# Patient Record
Sex: Female | Born: 1950 | Race: White | Hispanic: No | Smoking: Never smoker
Health system: Southern US, Community
[De-identification: ages and names within clinical notes are randomized; demographics above are authoritative.]

## PROBLEM LIST (undated history)

## (undated) DIAGNOSIS — I10 Essential (primary) hypertension: Secondary | ICD-10-CM

## (undated) DIAGNOSIS — R51 Headache: Secondary | ICD-10-CM

## (undated) DIAGNOSIS — F5104 Psychophysiologic insomnia: Secondary | ICD-10-CM

## (undated) DIAGNOSIS — I209 Angina pectoris, unspecified: Secondary | ICD-10-CM

## (undated) DIAGNOSIS — M1990 Unspecified osteoarthritis, unspecified site: Secondary | ICD-10-CM

## (undated) DIAGNOSIS — E875 Hyperkalemia: Secondary | ICD-10-CM

## (undated) DIAGNOSIS — E7800 Pure hypercholesterolemia, unspecified: Secondary | ICD-10-CM

## (undated) DIAGNOSIS — Z8719 Personal history of other diseases of the digestive system: Secondary | ICD-10-CM

## (undated) DIAGNOSIS — R519 Headache, unspecified: Secondary | ICD-10-CM

## (undated) DIAGNOSIS — D126 Benign neoplasm of colon, unspecified: Secondary | ICD-10-CM

## (undated) DIAGNOSIS — G4730 Sleep apnea, unspecified: Secondary | ICD-10-CM

## (undated) DIAGNOSIS — E538 Deficiency of other specified B group vitamins: Secondary | ICD-10-CM

## (undated) DIAGNOSIS — F32A Depression, unspecified: Secondary | ICD-10-CM

## (undated) DIAGNOSIS — E119 Type 2 diabetes mellitus without complications: Secondary | ICD-10-CM

## (undated) DIAGNOSIS — S0300XA Dislocation of jaw, unspecified side, initial encounter: Secondary | ICD-10-CM

## (undated) DIAGNOSIS — F419 Anxiety disorder, unspecified: Secondary | ICD-10-CM

## (undated) DIAGNOSIS — M654 Radial styloid tenosynovitis [de Quervain]: Secondary | ICD-10-CM

## (undated) DIAGNOSIS — E559 Vitamin D deficiency, unspecified: Secondary | ICD-10-CM

## (undated) DIAGNOSIS — E785 Hyperlipidemia, unspecified: Secondary | ICD-10-CM

## (undated) DIAGNOSIS — E669 Obesity, unspecified: Secondary | ICD-10-CM

## (undated) DIAGNOSIS — J302 Other seasonal allergic rhinitis: Secondary | ICD-10-CM

## (undated) DIAGNOSIS — T7840XA Allergy, unspecified, initial encounter: Secondary | ICD-10-CM

## (undated) DIAGNOSIS — F329 Major depressive disorder, single episode, unspecified: Secondary | ICD-10-CM

## (undated) HISTORY — PX: HERNIA REPAIR: SHX51

## (undated) HISTORY — PX: CARPAL TUNNEL RELEASE: SHX101

## (undated) HISTORY — PX: COLONOSCOPY: SHX174

## (undated) HISTORY — PX: KNEE ARTHROSCOPY: SUR90

---

## 2006-02-12 ENCOUNTER — Other Ambulatory Visit: Payer: Self-pay

## 2006-02-12 ENCOUNTER — Inpatient Hospital Stay: Payer: Self-pay | Admitting: Internal Medicine

## 2006-06-10 ENCOUNTER — Ambulatory Visit: Payer: Self-pay | Admitting: Family Medicine

## 2006-08-21 ENCOUNTER — Ambulatory Visit: Payer: Self-pay | Admitting: Gastroenterology

## 2007-03-12 ENCOUNTER — Other Ambulatory Visit: Payer: Self-pay

## 2007-03-12 ENCOUNTER — Ambulatory Visit: Payer: Self-pay | Admitting: Orthopedic Surgery

## 2007-03-26 ENCOUNTER — Ambulatory Visit: Payer: Self-pay | Admitting: Orthopedic Surgery

## 2007-07-02 ENCOUNTER — Ambulatory Visit: Payer: Self-pay | Admitting: Family Medicine

## 2008-09-02 ENCOUNTER — Ambulatory Visit: Payer: Self-pay | Admitting: Family Medicine

## 2008-09-20 ENCOUNTER — Ambulatory Visit: Payer: Self-pay | Admitting: Family Medicine

## 2008-11-14 ENCOUNTER — Ambulatory Visit: Payer: Self-pay | Admitting: Obstetrics and Gynecology

## 2008-11-14 ENCOUNTER — Ambulatory Visit: Payer: Self-pay | Admitting: Cardiology

## 2008-11-18 ENCOUNTER — Ambulatory Visit: Payer: Self-pay | Admitting: Obstetrics and Gynecology

## 2009-10-18 ENCOUNTER — Ambulatory Visit: Payer: Self-pay | Admitting: Family Medicine

## 2010-12-20 ENCOUNTER — Ambulatory Visit: Payer: Self-pay | Admitting: Family Medicine

## 2011-09-29 ENCOUNTER — Observation Stay: Payer: Self-pay | Admitting: Internal Medicine

## 2011-09-29 LAB — COMPREHENSIVE METABOLIC PANEL
Anion Gap: 13 (ref 7–16)
Calcium, Total: 9.1 mg/dL (ref 8.5–10.1)
Chloride: 107 mmol/L (ref 98–107)
Co2: 25 mmol/L (ref 21–32)
EGFR (African American): >60
EGFR (Non-African Amer.): >60
Osmolality: 298 (ref 275–301)
Potassium: 4.3 mmol/L (ref 3.5–5.1)
SGOT(AST): 22 U/L (ref 15–37)
SGPT (ALT): 26 U/L
Sodium: 145 mmol/L (ref 136–145)

## 2011-09-29 LAB — HEMOGLOBIN A1C: Hemoglobin A1C: 8.1 % — ABNORMAL HIGH (ref 4.2–6.3)

## 2011-09-29 LAB — CBC
HGB: 12.5 g/dL (ref 12.0–16.0)
MCH: 30.3 pg (ref 26.0–34.0)
RBC: 4.13 10*6/uL (ref 3.80–5.20)
RDW: 12.3 % (ref 11.5–14.5)
WBC: 8.3 10*3/uL (ref 3.6–11.0)

## 2011-09-29 LAB — CK-MB: CK-MB: 1.4 ng/mL (ref 0.5–3.6)

## 2011-09-29 LAB — APTT: Activated PTT: 28 secs (ref 23.6–35.9)

## 2011-09-29 LAB — TROPONIN I: Troponin-I: <0.02 ng/mL

## 2011-09-29 LAB — CK TOTAL AND CKMB (NOT AT ARMC): CK-MB: 1.4 ng/mL (ref 0.5–3.6)

## 2011-09-29 LAB — PROTIME-INR
INR: 0.9
Prothrombin Time: 12.4 secs (ref 11.5–14.7)

## 2011-09-30 LAB — LIPID PANEL
Cholesterol: 127 mg/dL (ref 0–200)
HDL Cholesterol: 38 mg/dL — ABNORMAL LOW (ref 40–60)
Ldl Cholesterol, Calc: 46 mg/dL (ref 0–100)
Triglycerides: 217 mg/dL — ABNORMAL HIGH (ref 0–200)
VLDL Cholesterol, Calc: 43 mg/dL — ABNORMAL HIGH (ref 5–40)

## 2011-09-30 LAB — BASIC METABOLIC PANEL
Calcium, Total: 9.3 mg/dL (ref 8.5–10.1)
Chloride: 104 mmol/L (ref 98–107)
Co2: 26 mmol/L (ref 21–32)
Glucose: 83 mg/dL (ref 65–99)
Osmolality: 289 (ref 275–301)
Potassium: 3.9 mmol/L (ref 3.5–5.1)

## 2011-09-30 LAB — CK-MB: CK-MB: 1.3 ng/mL (ref 0.5–3.6)

## 2011-09-30 LAB — CBC WITH DIFFERENTIAL/PLATELET
Basophil %: 0.4 %
Eosinophil %: 2.7 %
HGB: 12.2 g/dL (ref 12.0–16.0)
Lymphocyte %: 42.7 %
MCH: 31.2 pg (ref 26.0–34.0)
MCHC: 34 g/dL (ref 32.0–36.0)
Monocyte #: 0.7 10*3/uL (ref 0.0–0.7)
Monocyte %: 7.6 %
Neutrophil #: 4.4 10*3/uL (ref 1.4–6.5)
Neutrophil %: 46.6 %
Platelet: 259 10*3/uL (ref 150–440)
RBC: 3.93 10*6/uL (ref 3.80–5.20)

## 2011-09-30 LAB — TROPONIN I: Troponin-I: <0.02 ng/mL

## 2011-10-06 ENCOUNTER — Emergency Department: Payer: Self-pay | Admitting: Emergency Medicine

## 2011-10-06 LAB — COMPREHENSIVE METABOLIC PANEL
Alkaline Phosphatase: 84 U/L (ref 50–136)
Bilirubin,Total: 0.3 mg/dL (ref 0.2–1.0)
Calcium, Total: 9 mg/dL (ref 8.5–10.1)
Co2: 24 mmol/L (ref 21–32)
Creatinine: 0.83 mg/dL (ref 0.60–1.30)
EGFR (African American): >60
EGFR (Non-African Amer.): >60
Osmolality: 305 (ref 275–301)
Potassium: 4.4 mmol/L (ref 3.5–5.1)
SGPT (ALT): 30 U/L
Sodium: 145 mmol/L (ref 136–145)
Total Protein: 7.2 g/dL (ref 6.4–8.2)

## 2011-10-06 LAB — CBC
HCT: 38 % (ref 35.0–47.0)
HGB: 13.1 g/dL (ref 12.0–16.0)
MCH: 31.4 pg (ref 26.0–34.0)
MCHC: 34.4 g/dL (ref 32.0–36.0)
RBC: 4.17 10*6/uL (ref 3.80–5.20)
WBC: 8.1 10*3/uL (ref 3.6–11.0)

## 2011-10-06 LAB — TROPONIN I: Troponin-I: <0.02 ng/mL

## 2011-10-07 DIAGNOSIS — G4733 Obstructive sleep apnea (adult) (pediatric): Secondary | ICD-10-CM | POA: Insufficient documentation

## 2011-10-07 DIAGNOSIS — F32A Depression, unspecified: Secondary | ICD-10-CM | POA: Insufficient documentation

## 2012-03-19 DIAGNOSIS — F5104 Psychophysiologic insomnia: Secondary | ICD-10-CM | POA: Insufficient documentation

## 2012-08-13 ENCOUNTER — Ambulatory Visit: Payer: Self-pay | Admitting: Family Medicine

## 2012-08-13 LAB — RAPID STREP-A WITH REFLX: Micro Text Report: NEGATIVE

## 2012-08-13 LAB — RAPID INFLUENZA A&B ANTIGENS

## 2012-08-15 LAB — BETA STREP CULTURE(ARMC)

## 2012-10-22 DIAGNOSIS — M1990 Unspecified osteoarthritis, unspecified site: Secondary | ICD-10-CM | POA: Insufficient documentation

## 2012-11-23 DIAGNOSIS — E875 Hyperkalemia: Secondary | ICD-10-CM | POA: Insufficient documentation

## 2013-01-11 IMAGING — US US CAROTID DUPLEX BILAT
1 series · 17 of 24 positions shown · non-contrast
Comparison: none

REASON FOR EXAM: near syncope
COMMENTS:

[Series 1: us carotid duplex bilat · 17 of 70 slices shown]
[im 1/70]
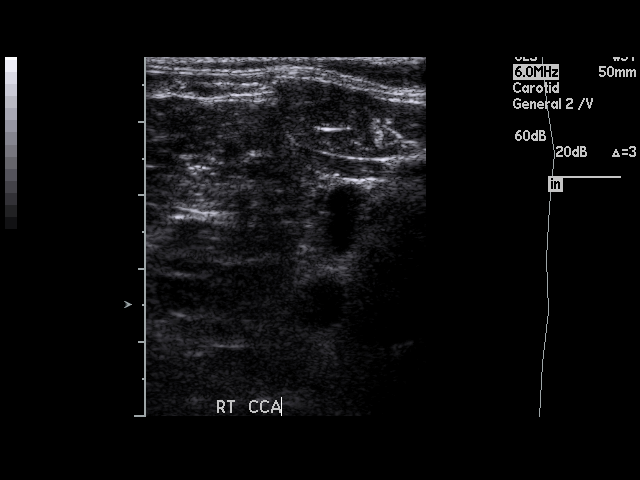
[im 7/70]
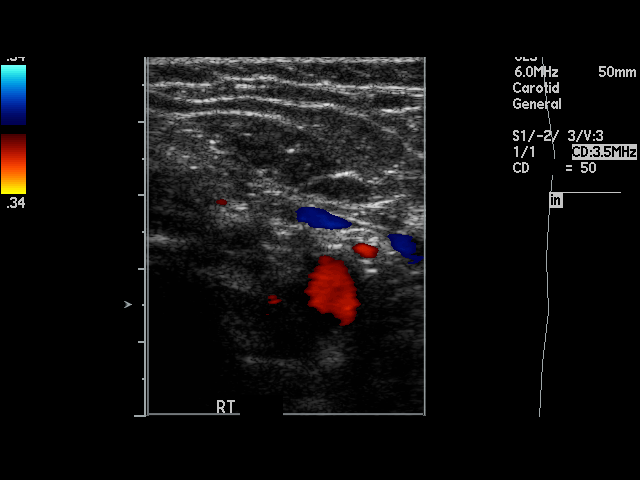
[im 10/70]
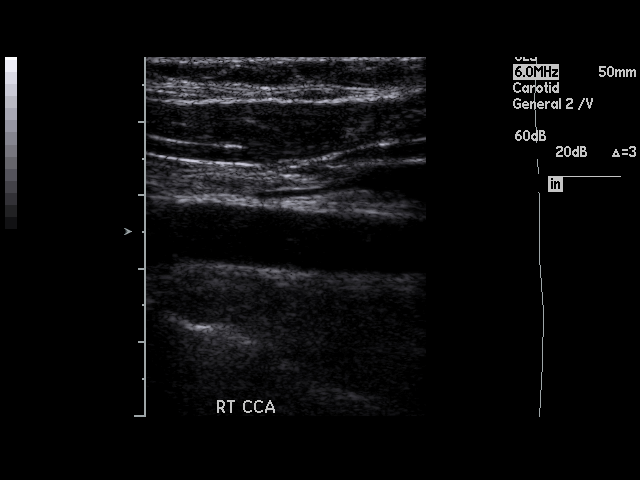
[im 13/70]
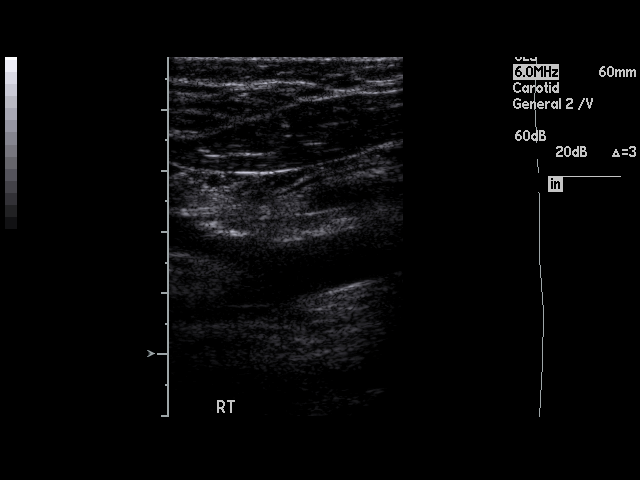
[im 19/70]
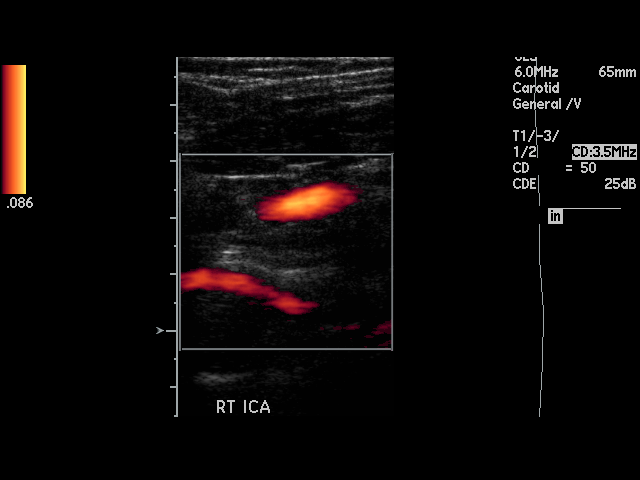
[im 22/70]
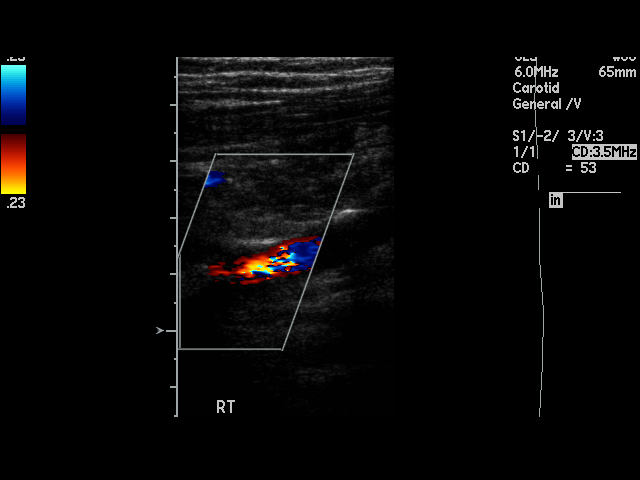
[im 28/70]
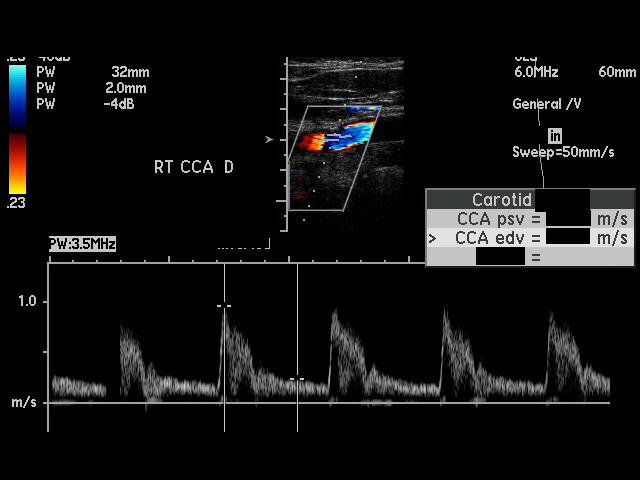
[im 31/70]
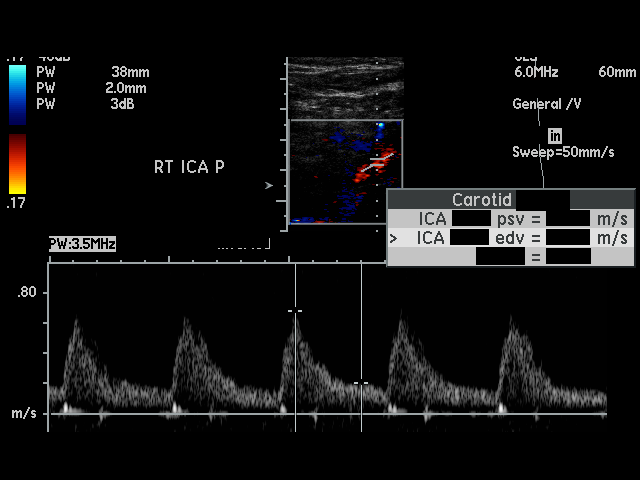
[im 37/70]
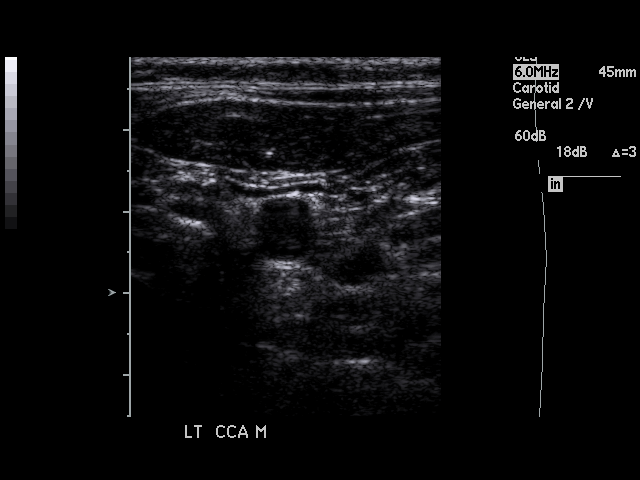
[im 40/70]
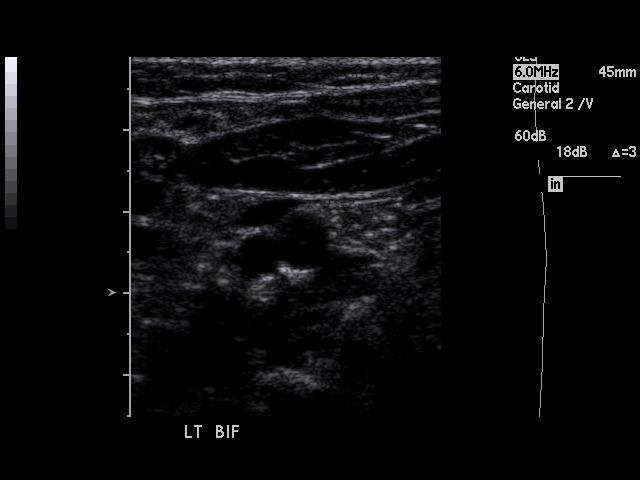
[im 43/70]
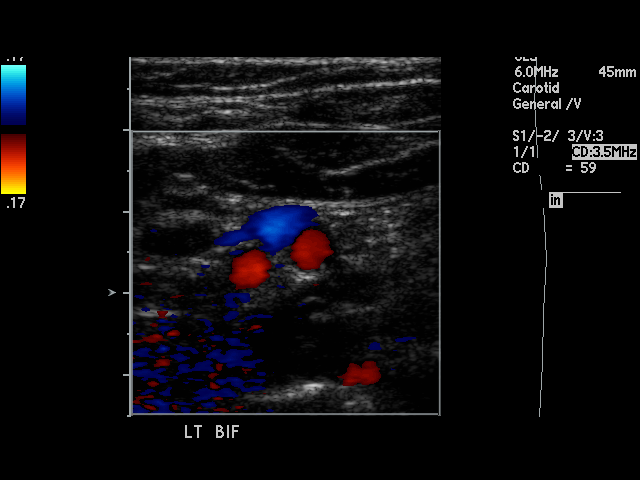
[im 49/70]
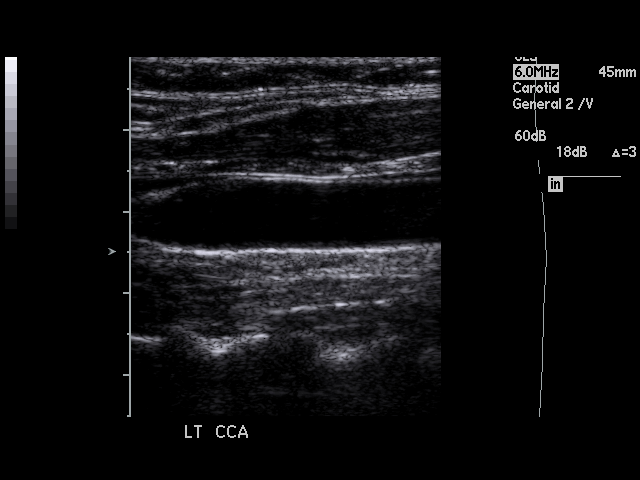
[im 52/70]
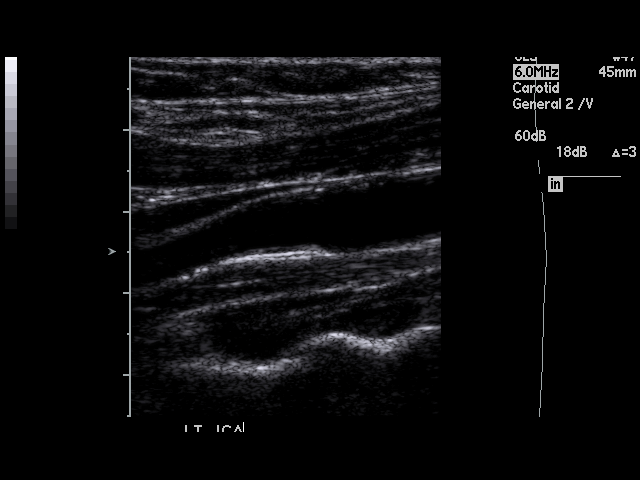
[im 58/70]
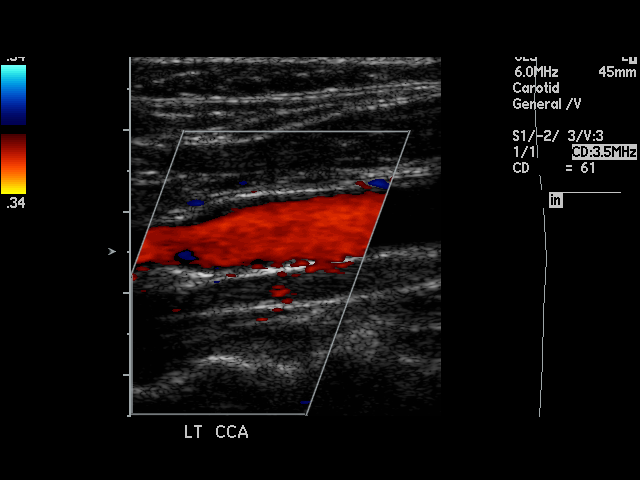
[im 61/70]
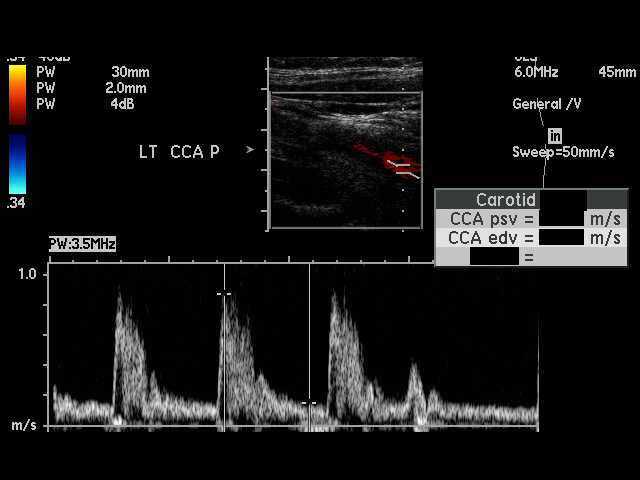
[im 64/70]
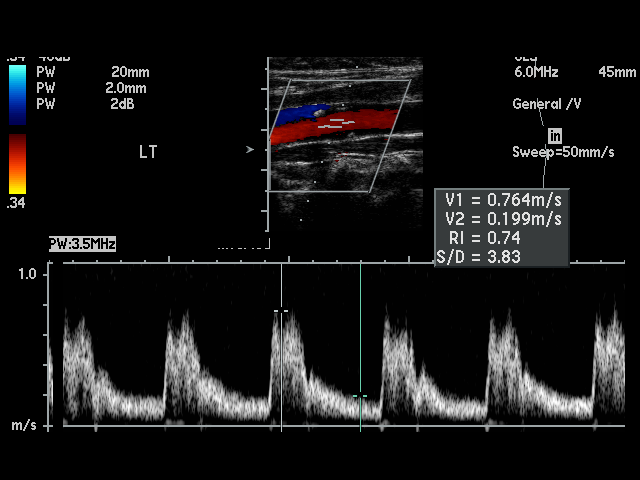
[im 70/70]
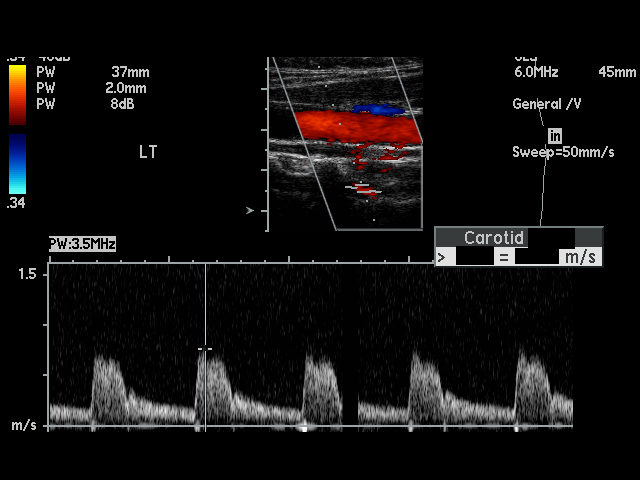

[17 of 24 positions shown; findings below may reference images not displayed]

PROCEDURE:     US  - US CAROTID DOPPLER BILATERAL  - September 29, 2011  [DATE]

RESULT:     Carotid Doppler interrogation demonstrates the presence of
calcified plaque in the carotid bulb region of the right carotid artery with
some intimal thickening with calcified and noncalcified plaques in the left
carotid bulb and proximal internal carotid. Neither of these sites cause
significant stenosis. The color and spectral Doppler appearance is within
normal limits bilaterally. Antegrade flow is seen in the vertebral arteries
bilaterally. The peak systolic velocities are normal. The internal to common
carotid peak systolic velocity ratio is 0.95 normal right and 1.22 on the
left.
IMPRESSION: 1. Minimal atherosclerotic disease. No evidence of hemodynamically
significant stenosis.

## 2013-02-09 ENCOUNTER — Ambulatory Visit: Payer: Self-pay

## 2013-02-19 ENCOUNTER — Ambulatory Visit: Payer: Self-pay | Admitting: Emergency Medicine

## 2013-02-23 ENCOUNTER — Ambulatory Visit: Payer: Self-pay | Admitting: Family Medicine

## 2013-04-07 DIAGNOSIS — E538 Deficiency of other specified B group vitamins: Secondary | ICD-10-CM | POA: Insufficient documentation

## 2013-08-08 ENCOUNTER — Ambulatory Visit: Payer: Self-pay | Admitting: Family Medicine

## 2013-12-26 DIAGNOSIS — E669 Obesity, unspecified: Secondary | ICD-10-CM | POA: Insufficient documentation

## 2014-01-09 DIAGNOSIS — Z8719 Personal history of other diseases of the digestive system: Secondary | ICD-10-CM | POA: Insufficient documentation

## 2014-03-23 ENCOUNTER — Ambulatory Visit: Payer: Self-pay | Admitting: Family Medicine

## 2014-06-08 DIAGNOSIS — N3946 Mixed incontinence: Secondary | ICD-10-CM | POA: Insufficient documentation

## 2014-07-15 ENCOUNTER — Ambulatory Visit: Payer: Self-pay | Admitting: Physician Assistant

## 2014-07-15 LAB — COMPREHENSIVE METABOLIC PANEL
Albumin: 4.1 g/dL (ref 3.4–5.0)
Alkaline Phosphatase: 96 U/L
Anion Gap: 13 (ref 7–16)
BILIRUBIN TOTAL: 0.4 mg/dL (ref 0.2–1.0)
BUN: 18 mg/dL (ref 7–18)
CREATININE: 0.81 mg/dL (ref 0.60–1.30)
Calcium, Total: 10 mg/dL (ref 8.5–10.1)
Chloride: 100 mmol/L (ref 98–107)
Co2: 27 mmol/L (ref 21–32)
EGFR (Non-African Amer.): >60
Glucose: 230 mg/dL — ABNORMAL HIGH (ref 65–99)
OSMOLALITY: 289 (ref 275–301)
POTASSIUM: 4.1 mmol/L (ref 3.5–5.1)
SGOT(AST): 34 U/L (ref 15–37)
SGPT (ALT): 49 U/L
SODIUM: 140 mmol/L (ref 136–145)
Total Protein: 7.9 g/dL (ref 6.4–8.2)

## 2014-09-23 ENCOUNTER — Ambulatory Visit: Payer: Self-pay | Admitting: Family Medicine

## 2014-12-04 NOTE — H&P (Signed)
PATIENT NAME:  Diana Roberts, Diana Roberts MR#:  [id number] DATE OF BIRTH:  1950/12/16  DATE OF ADMISSION:  09/29/2011  ED REFERRING PHYSICIAN: Dr. Fonnie Birkenhead  PRIMARY CARE PHYSICIAN: Dr. Margret Chance   CHIEF COMPLAINT: Double vision, near syncope, chest pressure.   HISTORY OF PRESENT ILLNESS: Patient is a 64 year old white female with history of depression, sleep apnea, hyperlipidemia, type 2 diabetes, previous history of chest pain who presents to the ED with multiple complaints. Patient reports that she earlier this morning was driving her car when all of a sudden she started to have double vision. She saw the road started being double so she had to pull over. Also yesterday she reports that she was feeling dizzy and almost had an episode where she passed out but did not pass out. This has been going on for the past 2 to 3 weeks. In terms of her vision she did not have these visual difficulties previously. She has not had any pain in her eyes. No redness. No drainage. Her last eye exam was about a year ago. She reports that now the double vision has resolved. In terms of the near passing out episode, patient reports that there is no relationship to standing or sitting. She just feels very dizzy all of a sudden and feels like she is going to pass out but has not passed out. She has not had any episodes of her heart beating fast or skipping beats. Patient also reports that she has been having left-sided chest pressure as well as pain going on for the past two weeks. She reports that the pain/pressure goes from her chest to her back and then sometimes comes back to front. She also states that sometimes deep breathing makes it worse, also certain movements make her chest pressure worse. She denies any dyspnea on exertion, orthopnea. No fevers. No chills. No cough. She is a diabetic and noticed her blood sugars to be 47 last night, ate some sweets and this morning her sugars were back up to 133. She otherwise  denies any abdominal pain, nausea, vomiting, or diarrhea.   PAST MEDICAL HISTORY:  1. Depression.  2. Sleep apnea, uses CPAP at bedtime.  3. Hyperlipidemia.  4. Diabetes type 2, insulin requiring.  5. History of chest pain in 2010 with negative stress test.    PAST SURGICAL HISTORY:  1. Status post right knee arthroscopy. 2. Status post right trigger finger release.  3. Bilateral carpal tunnel release.  4. Multiple abdominal hernia surgeries. 5. Left knee arthroscopy. 6. Cesarean section x2.   ALLERGIES: Cipro.   CURRENT MEDICATIONS:  1. Aspirin 81, 1 tab p.o. daily.  2. Carvedilol 3.125, 1 tab p.o. b.i.d.  3. Cyclobenzaprine 10 mg 1 tab p.o. daily as needed.  4. Glyburide 5 mg daily.  5. Imitrex 100, 1 tab p.o. daily as needed.  6. Lantus 70 units at bedtime. 7. Lisinopril 40 daily.  8. Metformin 2000 mg at bedtime.   9. Metformin 500 mg q.a.m.  10. NovoLog sliding scale insulin 4 units q.a.m., 8 units at noon, 12 units at bedtime.  11. Sertraline 100, 1 tab p.o. daily.  12. Simvastatin 40 mg at bedtime.  13. Ambien 10 at bedtime as needed.   SOCIAL HISTORY: Denies any alcohol use. No smoking or drugs.   FAMILY HISTORY: Father had died from complication of aortic aneurysm. Mother died from cancer.    REVIEW OF SYSTEMS: CONSTITUTIONAL: Complains of fatigue, weakness, chest pain as above. No weight loss. No  weight gain. EYES: Double vision as described above. No blurred vision. No pain. No redness. No inflammation. She was told that she may have early cataracts on her last eye exam. ENT: No tinnitus. No ear pain. No hearing loss. No allergies, seasonal or year-round. No epistaxis. No nasal discharge. No postnasal drip. No difficulty swallowing. RESPIRATORY: No cough. No wheezing. No hemoptysis. No chronic obstructive pulmonary disease. No tuberculosis. CARDIOVASCULAR: Chest pain as above. No orthopnea. No edema. No arrhythmia. No palpitations. No syncope. GENITOURINARY: Denies  any dysuria, hematuria, renal calculus, or frequency. ENDO: Denies any polydipsia, nocturia, or thyroid problems. HEME/LYMPH: Denies any anemia, easy bruisability, or bleeding. SKIN: No acne. No rash. No changes in mole or hair. MUSCULOSKELETAL: Denies any pain in neck, back, or shoulder. NEURO: No numbness. No cerebrovascular accident. No transient ischemic attack. PSYCHIATRIC: No anxiety. No insomnia. No ADD. No OCD.   PHYSICAL EXAMINATION:  VITAL SIGNS: Temperature 96.4, pulse 67, respirations 22, blood pressure 152/62.   GENERAL: Patient is an obese female, currently not in any acute distress.   HEENT: Head atraumatic, normocephalic. Pupils equally round, reactive to light and accommodation. Anicteric sclerae. No conjunctival pallor. Extraocular movements intact. Oropharynx is clear. There are no exudate. Nasal exam shows no drainage or ulceration.   NECK: There is no thyromegaly. No carotid bruits.   CARDIOVASCULAR: Regular rate and rhythm. No murmurs, rubs, clicks, or gallops. PMI is not displaced.   LUNGS: No accessory muscle usage. Clear to auscultation bilaterally without any rales, rhonchi, wheezing.   ABDOMEN: Soft, nontender, nondistended. Positive bowel sounds x4. There is no hepatosplenomegaly.   EXTREMITIES: No clubbing, cyanosis, edema.   SKIN: There is no rash.   LYMPHATICS: No lymph nodes palpable.   MUSCULOSKELETAL: There is no erythema or swelling.   VASCULAR: Good DP, PT pulses.   SKIN: There is no rash.   PSYCHIATRIC: Not anxious or depressed.   NEUROLOGICAL: Cranial nerves II through XII grossly intact. No focal deficits.   LABORATORY, DIAGNOSTIC, AND RADIOLOGICAL DATA: CT scan of the head without contrast shows no acute abnormality. PA and lateral chest x-ray showed no acute cardiopulmonary processes. CPK 112, CK-MB 1.4, glucose 208, BUN 23, creatinine 0.79, sodium 145, potassium 4.3, chloride 107, CO2 25. LFTs were normal. WBC 8.3, hemoglobin 12.5, platelet  count 274. Troponin was less than 0.02.   ASSESSMENT AND PLAN: Patient is a 64 year old white female who comes in with double vision, near syncope, chest pain.  1. Double vision, which is now resolved. Also has near syncope. At this time will make sure that she is not having any symptoms of transient ischemic attack. Will check carotids, echocardiogram. If these evaluations are negative then she will need outpatient ophthalmology evaluation. Her last examination one year ago. Will also monitor on telemetry.  2. Chest pressure. Seems very atypical but in light of having near syncope, has multiple risk factors for underlying coronary artery disease  go ahead and do a stress maybe in the a.m. Check a d-dimer, if elevated will need CT per PE protocol.  3. Sleep apnea. Continue CPAP as taking at home.  4. Diabetes, type 2. Will continue Lantus, glyburide and metformin. Due to hypoglycemia last night will hold premeal scheduled insulin.  5. Hyperlipidemia. Continue simvastatin. Check a fasting lipid panel in the a.m.  6. Depression. Will continue sertraline as taking.  7. Miscellaneous. I will place her on Lovenox for deep vein thrombosis prophylaxis.   TIME SPENT: 35 minutes.   ____________________________ Lafonda Mosses Posey Pronto, MD  shp:cms D: 09/29/2011 12:37:16 ET T: 09/29/2011 13:16:02 ET JOB#: [id number]  cc: Shree Espey H. Posey Pronto, MD, <Dictator> Kathrynn Humble. Dollene Primrose, MD Alric Seton MD ELECTRONICALLY SIGNED 10/18/2011 7:47

## 2014-12-04 NOTE — Discharge Summary (Signed)
PATIENT NAME:  Diana Roberts, Diana Roberts MR#:  [id number] DATE OF BIRTH:  1951-06-29  DATE OF ADMISSION:  09/29/2011 DATE OF DISCHARGE:  09/30/2011  ADMITTING PHYSICIAN:  Dr. Dustin Flock DISCHARGING PHYSICIAN: Dr. Gladstone Lighter   PRIMARY CARE PHYSICIAN: Dr. Dollene Primrose    CONSULTATIONS IN THE HOSPITAL:  None.  DISCHARGE DIAGNOSES:  1. Near syncopal episode with dizziness and double vision, likely stress related, poor diabetes control. 2. Sleep apnea.  3. Insulin-dependent diabetes mellitus.  4. Depression.  5. Hyperlipemia.   DISCHARGE MEDICATIONS:  1. Lantus 70 units subcutaneous at bedtime.  2. Imitrex 100 mg p.o. daily.  3. Aspirin 81 mg p.o. daily.  4. Flexeril 10 mg p.o. daily p.r.n.  5. Ambien 10 mg at bedtime daily.  6. Coreg 3.125 mg p.o. b.i.d.  7. Simvastatin 40 mg p.o. daily.  8. Lisinopril 40 mg p.o. daily.  9. Zoloft 100 mg p.o. daily.  10. Metformin 2000 mg p.o. at bedtime and metformin 500 mg p.o. in the morning.  11. Glyburide 5 mg p.o. daily.  12. NovoLog insulin 4 units subcutaneous q. a.m. prior to breakfast, 8 units at noon, and 12 units at  bedtime.   DISCHARGE DIET: Low sodium ADA diet.   DISCHARGE ACTIVITY: As tolerated.     FOLLOWUP INSTRUCTIONS:   Primary care physician followup in one week.    LABS AT THE TIME OF DISCHARGE: WBC 9.4, hemoglobin 12.3, hematocrit 36, and platelet count 259.  Sodium 144, potassium 3.9, chloride 104, bicarbonate 26, BUN 22, creatinine 0.7, glucose of 83, calcium 9.3.   LDL 46, HDL 38, triglycerides 217, total cholesterol 127. Cardiac enzymes have remained negative while in the hospital.   Chest x-ray on admission showing no acute cardiopulmonary disease. CT of the head without contrast showing no acute intracranial abnormality. Ultrasound carotid Doppler showing minimal atherosclerotic disease without evidence of hemodynamically significant stenosis.   Echo Doppler showing normal LV systolic function, ejection fraction of  65%, mild mitral and tricuspid regurgitation.  Hemoglobin A1c elevated at 8.1. Stress test showing negative LexiScan infusion, ejection fraction of 65%. No evidence of infarction or ischemia.   HOSPITAL COURSE:  Diana Roberts is a 64 year old female with past medical history significant for hypertension, diabetes, sleep apnea, and depression who presented to the hospital complaining of dizziness and near syncopal episode.  1. Near syncopal episode with dizziness and double vision:  She has been having intermittent symptoms like this over the past 2 to 3 weeks. She says she is under a lot of stress as her husband is sick and has not been taking care of herself including not taking medications for the last month. She was admitted to telemetry under observation. Her telemetry monitoring was normal sinus rhythm. She did not have any of those episodes while she was in the hospital. Her carotid Dopplers, CT head, and echo were normal and she had a normal LexiScan Myoview. Most likely her symptoms could have been related to the recent stress that she is experiencing or poorly controlled diabetes. It has been a while since she has gone to the ophthalmologist so she is advised to have outpatient followup with ophthalmology for her double vision. 2. Diabetes mellitus, hemoglobin A1c 8.1: She can continue her home medications of metformin, Lantus, and glyburide.  3. Sleep apnea:  She uses CPAP at bedtime. 4. Depression: She is on Zoloft for the same.    5. No changes have been made to her medications. She was advised to continue all medications. Follow  up with PCP in one week.   DISCHARGE CONDITION: Stable.   DISCHARGE DISPOSITION: Home.   TIME SPENT ON DISCHARGE: 40 minutes.   ____________________________ Gladstone Lighter, MD rk:bjt D: 09/30/2011 14:56:00 ET T: 10/01/2011 11:01:50 ET JOB#: [id number]  cc: Kathrynn Humble. Dollene Primrose, MD Gladstone Lighter, MD, <Dictator> Gladstone Lighter MD ELECTRONICALLY SIGNED  10/02/2011 11:55

## 2015-02-01 ENCOUNTER — Encounter: Payer: Self-pay | Admitting: *Deleted

## 2015-02-07 NOTE — Progress Notes (Signed)
Called Dr.Brasingtons office to verify medication orders, okay to use neosynephrine with pt allergy per Jenny Reichmann at [place] office.

## 2015-02-07 NOTE — Discharge Instructions (Signed)

## 2015-02-08 ENCOUNTER — Ambulatory Visit
Admission: RE | Admit: 2015-02-08 | Discharge: 2015-02-08 | Disposition: A | Discharge status: Home / Self Care | Payer: BLUE CROSS/BLUE SHIELD | Source: Ambulatory Visit | Attending: Ophthalmology | Admitting: Ophthalmology

## 2015-02-08 ENCOUNTER — Ambulatory Visit: Payer: BLUE CROSS/BLUE SHIELD | Admitting: Student in an Organized Health Care Education/Training Program

## 2015-02-08 ENCOUNTER — Encounter
Admission: RE | Disposition: A | Discharge status: Home / Self Care | Payer: BLUE CROSS/BLUE SHIELD | Source: Ambulatory Visit | Attending: Ophthalmology

## 2015-02-08 ENCOUNTER — Encounter: Payer: Self-pay | Admitting: *Deleted

## 2015-02-08 DIAGNOSIS — Z886 Allergy status to analgesic agent status: Secondary | ICD-10-CM | POA: Insufficient documentation

## 2015-02-08 DIAGNOSIS — K449 Diaphragmatic hernia without obstruction or gangrene: Secondary | ICD-10-CM | POA: Insufficient documentation

## 2015-02-08 DIAGNOSIS — E780 Pure hypercholesterolemia: Secondary | ICD-10-CM | POA: Diagnosis not present

## 2015-02-08 DIAGNOSIS — Z881 Allergy status to other antibiotic agents status: Secondary | ICD-10-CM | POA: Insufficient documentation

## 2015-02-08 DIAGNOSIS — M1990 Unspecified osteoarthritis, unspecified site: Secondary | ICD-10-CM | POA: Insufficient documentation

## 2015-02-08 DIAGNOSIS — G4730 Sleep apnea, unspecified: Secondary | ICD-10-CM | POA: Insufficient documentation

## 2015-02-08 DIAGNOSIS — R062 Wheezing: Secondary | ICD-10-CM | POA: Diagnosis not present

## 2015-02-08 DIAGNOSIS — R002 Palpitations: Secondary | ICD-10-CM | POA: Insufficient documentation

## 2015-02-08 DIAGNOSIS — H2512 Age-related nuclear cataract, left eye: Secondary | ICD-10-CM | POA: Insufficient documentation

## 2015-02-08 DIAGNOSIS — F329 Major depressive disorder, single episode, unspecified: Secondary | ICD-10-CM | POA: Diagnosis not present

## 2015-02-08 DIAGNOSIS — I209 Angina pectoris, unspecified: Secondary | ICD-10-CM | POA: Diagnosis not present

## 2015-02-08 DIAGNOSIS — M7989 Other specified soft tissue disorders: Secondary | ICD-10-CM | POA: Diagnosis not present

## 2015-02-08 DIAGNOSIS — E119 Type 2 diabetes mellitus without complications: Secondary | ICD-10-CM | POA: Diagnosis not present

## 2015-02-08 DIAGNOSIS — F419 Anxiety disorder, unspecified: Secondary | ICD-10-CM | POA: Diagnosis not present

## 2015-02-08 DIAGNOSIS — G43909 Migraine, unspecified, not intractable, without status migrainosus: Secondary | ICD-10-CM | POA: Diagnosis not present

## 2015-02-08 DIAGNOSIS — I10 Essential (primary) hypertension: Secondary | ICD-10-CM | POA: Diagnosis not present

## 2015-02-08 HISTORY — DX: Unspecified osteoarthritis, unspecified site: M19.90

## 2015-02-08 HISTORY — DX: Headache, unspecified: R51.9

## 2015-02-08 HISTORY — PX: CATARACT EXTRACTION W/PHACO: SHX586

## 2015-02-08 HISTORY — DX: Angina pectoris, unspecified: I20.9

## 2015-02-08 HISTORY — DX: Anxiety disorder, unspecified: F41.9

## 2015-02-08 HISTORY — DX: Major depressive disorder, single episode, unspecified: F32.9

## 2015-02-08 HISTORY — DX: Essential (primary) hypertension: I10

## 2015-02-08 HISTORY — DX: Depression, unspecified: F32.A

## 2015-02-08 HISTORY — DX: Other seasonal allergic rhinitis: J30.2

## 2015-02-08 HISTORY — DX: Pure hypercholesterolemia, unspecified: E78.00

## 2015-02-08 HISTORY — DX: Headache: R51

## 2015-02-08 HISTORY — DX: Sleep apnea, unspecified: G47.30

## 2015-02-08 HISTORY — DX: Personal history of other diseases of the digestive system: Z87.19

## 2015-02-08 HISTORY — DX: Type 2 diabetes mellitus without complications: E11.9

## 2015-02-08 LAB — GLUCOSE, CAPILLARY
GLUCOSE-CAPILLARY: 120 mg/dL — AB (ref 65–99)
Glucose-Capillary: 144 mg/dL — ABNORMAL HIGH (ref 65–99)

## 2015-02-08 SURGERY — PHACOEMULSIFICATION, CATARACT, WITH IOL INSERTION
Anesthesia: Monitor Anesthesia Care | Laterality: Left | Wound class: Clean

## 2015-02-08 MED ORDER — OXYCODONE HCL 5 MG PO TABS: 5.0000 mg | ORAL_TABLET | Freq: Once | ORAL | Status: DC | PRN

## 2015-02-08 MED ORDER — EPINEPHRINE HCL 1 MG/ML IJ SOLN
INTRAOCULAR | Status: DC | PRN
  Administered 2015-02-08: 66 mL via OPHTHALMIC

## 2015-02-08 MED ORDER — PHENYLEPHRINE HCL 10 % OP SOLN
1.0000 [drp] | OPHTHALMIC | Status: DC | PRN
  Administered 2015-02-08 (×4): 1 [drp] via OPHTHALMIC

## 2015-02-08 MED ORDER — BRIMONIDINE TARTRATE 0.2 % OP SOLN
OPHTHALMIC | Status: DC | PRN
  Administered 2015-02-08: 1 [drp] via OPHTHALMIC

## 2015-02-08 MED ORDER — CEFUROXIME OPHTHALMIC INJECTION 1 MG/0.1 ML
INJECTION | OPHTHALMIC | Status: DC | PRN
  Administered 2015-02-08: .3 mL via INTRACAMERAL

## 2015-02-08 MED ORDER — KETOROLAC TROMETHAMINE 30 MG/ML IJ SOLN: 30.0000 mg | Freq: Once | INTRAMUSCULAR | Status: DC | PRN

## 2015-02-08 MED ORDER — NA HYALUR & NA CHOND-NA HYALUR 0.4-0.35 ML IO KIT
PACK | INTRAOCULAR | Status: DC | PRN
  Administered 2015-02-08: 1 mL via INTRAOCULAR

## 2015-02-08 MED ORDER — HYDROMORPHONE HCL 1 MG/ML IJ SOLN: 0.2500 mg | INTRAMUSCULAR | Status: DC | PRN

## 2015-02-08 MED ORDER — FENTANYL CITRATE (PF) 100 MCG/2ML IJ SOLN
INTRAMUSCULAR | Status: DC | PRN
  Administered 2015-02-08: 50 ug via INTRAVENOUS

## 2015-02-08 MED ORDER — MIDAZOLAM HCL 2 MG/2ML IJ SOLN
INTRAMUSCULAR | Status: DC | PRN
  Administered 2015-02-08: 2 mg via INTRAVENOUS

## 2015-02-08 MED ORDER — PROMETHAZINE HCL 25 MG/ML IJ SOLN: 6.2500 mg | INTRAMUSCULAR | Status: DC | PRN

## 2015-02-08 MED ORDER — CYCLOPENTOLATE HCL 2 % OP SOLN
1.0000 [drp] | OPHTHALMIC | Status: DC | PRN
  Administered 2015-02-08 (×4): 1 [drp] via OPHTHALMIC

## 2015-02-08 MED ORDER — POLYMYXIN B-TRIMETHOPRIM 10000-0.1 UNIT/ML-% OP SOLN
1.0000 [drp] | OPHTHALMIC | Status: DC | PRN
  Administered 2015-02-08 (×3): 1 [drp] via OPHTHALMIC

## 2015-02-08 MED ORDER — TETRACAINE HCL 0.5 % OP SOLN
1.0000 [drp] | OPHTHALMIC | Status: DC | PRN
  Administered 2015-02-08 (×2): 1 [drp] via OPHTHALMIC

## 2015-02-08 MED ORDER — OXYCODONE HCL 5 MG/5ML PO SOLN: 5.0000 mg | Freq: Once | ORAL | Status: DC | PRN

## 2015-02-08 MED ORDER — TIMOLOL MALEATE 0.5 % OP SOLN
OPHTHALMIC | Status: DC | PRN
  Administered 2015-02-08: 1 [drp] via OPHTHALMIC

## 2015-02-08 MED ORDER — POVIDONE-IODINE 5 % OP SOLN
1.0000 "application " | OPHTHALMIC | Status: DC | PRN
  Administered 2015-02-08: 1 via OPHTHALMIC

## 2015-02-08 MED ORDER — LIDOCAINE HCL 2 % EX GEL: 1.0000 "application " | Freq: Once | CUTANEOUS | Status: DC

## 2015-02-08 SURGICAL SUPPLY — 26 items
CANNULA ANT/CHMB 27GA (MISCELLANEOUS) ×3 IMPLANT
GLOVE SURG LX 7.5 STRW (GLOVE) ×2
GLOVE SURG LX STRL 7.5 STRW (GLOVE) ×1 IMPLANT
GLOVE SURG TRIUMPH 8.0 PF LTX (GLOVE) ×3 IMPLANT
GOWN STRL REUS W/ TWL LRG LVL3 (GOWN DISPOSABLE) ×2 IMPLANT
GOWN STRL REUS W/TWL LRG LVL3 (GOWN DISPOSABLE) ×4
LENS IOL TECNIS 19.5 (Intraocular Lens) ×3 IMPLANT
LENS IOL TECNIS MONO 1P 19.5 (Intraocular Lens) ×1 IMPLANT
MARKER SKIN SURG W/RULER VIO (MISCELLANEOUS) ×3 IMPLANT
NDL RETROBULBAR .5 NSTRL (NEEDLE) IMPLANT
NEEDLE FILTER BLUNT 18X 1/2SAF (NEEDLE) ×2
NEEDLE FILTER BLUNT 18X1 1/2 (NEEDLE) ×1 IMPLANT
PACK CATARACT BRASINGTON (MISCELLANEOUS) ×3 IMPLANT
PACK EYE AFTER SURG (MISCELLANEOUS) ×3 IMPLANT
PACK OPTHALMIC (MISCELLANEOUS) ×3 IMPLANT
RING MALYGIN 7.0 (MISCELLANEOUS) IMPLANT
SUT ETHILON 10-0 CS-B-6CS-B-6 (SUTURE)
SUT VICRYL  9 0 (SUTURE)
SUT VICRYL 9 0 (SUTURE) IMPLANT
SUTURE EHLN 10-0 CS-B-6CS-B-6 (SUTURE) IMPLANT
SYR 3ML LL SCALE MARK (SYRINGE) ×3 IMPLANT
SYR 5ML LL (SYRINGE) IMPLANT
SYR TB 1ML LUER SLIP (SYRINGE) ×3 IMPLANT
WATER STERILE IRR 250ML POUR (IV SOLUTION) ×3 IMPLANT
WATER STERILE IRR 500ML POUR (IV SOLUTION) IMPLANT
WIPE NON LINTING 3.25X3.25 (MISCELLANEOUS) ×3 IMPLANT

## 2015-02-08 NOTE — Op Note (Signed)
OPERATIVE NOTE  Diana Roberts [id number] 02/08/2015   PREOPERATIVE DIAGNOSIS:  Nuclear sclerotic cataract left eye. H25.12   POSTOPERATIVE DIAGNOSIS:    Nuclear sclerotic cataract left eye.     PROCEDURE:  Phacoemusification with posterior chamber intraocular lens placement of the left eye   LENS:   Implant Name Type Inv. Item Serial No. Manufacturer Lot No. LRB No. Used  LENS IMPL INTRAOC ZCB00 19.5 - [id number] Intraocular Lens LENS IMPL INTRAOC ZCB00 19.5 [id number] AMO   Left 1        ULTRASOUND TIME: 8.5  % of 1 minutes 0 seconds, CDE 5.1  SURGEON:  Wyonia Hough, MD   ANESTHESIA:  Topical with tetracaine drops and 2% Xylocaine jelly.   COMPLICATIONS:  None.   DESCRIPTION OF PROCEDURE:  The patient was identified in the holding room and transported to the operating room and placed in the supine position under the operating microscope.  The left eye was identified as the operative eye and it was prepped and draped in the usual sterile ophthalmic fashion.   A 1 millimeter clear-corneal paracentesis was made at the 1:30 position.  The anterior chamber was filled with Viscoat viscoelastic.  A 2.4 millimeter keratome was used to make a near-clear corneal incision at the 10:30 position.  .  A curvilinear capsulorrhexis was made with a cystotome and capsulorrhexis forceps.  Balanced salt solution was used to hydrodissect and hydrodelineate the nucleus.   Phacoemulsification was then used in stop and chop fashion to remove the lens nucleus and epinucleus.  The remaining cortex was then removed using the irrigation and aspiration handpiece. Provisc was then placed into the capsular bag to distend it for lens placement.  A lens was then injected into the capsular bag.  The remaining viscoelastic was aspirated.   Wounds were hydrated with balanced salt solution.  The anterior chamber was inflated to a physiologic pressure with balanced salt solution.  No wound leaks were noted.  Cefuroxime 0.1 ml of a 10mg /ml solution was injected into the anterior chamber for a dose of 1 mg of intracameral antibiotic at the completion of the case.   Timolol and Brimonidine drops were applied to the eye.  The patient was taken to the recovery room in stable condition without complications of anesthesia or surgery.  [name] 02/08/2015, 8:04 AM

## 2015-02-08 NOTE — Anesthesia Postprocedure Evaluation (Signed)
  Anesthesia Post-op Note  Patient: Diana Roberts  Procedure(s) Performed: Procedure(s) with comments: CATARACT EXTRACTION PHACO AND INTRAOCULAR LENS PLACEMENT (IOC) (Left) - DIABETIC  Anesthesia type:General, MAC  Patient location: PACU  Post pain: Pain level controlled  Post assessment: Post-op Vital signs reviewed, Patient's Cardiovascular Status Stable, Respiratory Function Stable, Patent Airway and No signs of Nausea or vomiting  Post vital signs: Reviewed and stable  Last Vitals:  Filed Vitals:   02/08/15 0808  BP:   Pulse: 65  Temp:   Resp:     Level of consciousness: awake, alert  and patient cooperative  Complications: No apparent anesthesia complications

## 2015-02-08 NOTE — Anesthesia Procedure Notes (Signed)
Procedure Name: MAC Date/Time: 02/08/2015 7:54 AM Performed by: Londell Moh Pre-anesthesia Checklist: Patient identified, Emergency Drugs available, Suction available, Timeout performed and Patient being monitored Patient Re-evaluated:Patient Re-evaluated prior to inductionOxygen Delivery Method: Nasal cannula Placement Confirmation: positive ETCO2

## 2015-02-08 NOTE — H&P (Signed)
  The History and Physical notes were scanned in.  The patient remains stable and unchanged from the H&P.   Previous H&P reviewed, patient examined, and there are no changes.  Oluwatomisin Deman 02/08/2015 7:33 AM

## 2015-02-08 NOTE — Transfer of Care (Signed)
Immediate Anesthesia Transfer of Care Note  Patient: Diana Roberts  Procedure(s) Performed: Procedure(s) with comments: CATARACT EXTRACTION PHACO AND INTRAOCULAR LENS PLACEMENT (IOC) (Left) - DIABETIC  Patient Location: PACU  Anesthesia Type: General, MAC  Level of Consciousness: awake, alert  and patient cooperative  Airway and Oxygen Therapy: Patient Spontanous Breathing and Patient connected to supplemental oxygen  Post-op Assessment: Post-op Vital signs reviewed, Patient's Cardiovascular Status Stable, Respiratory Function Stable, Patent Airway and No signs of Nausea or vomiting  Post-op Vital Signs: Reviewed and stable  Complications: No apparent anesthesia complications

## 2015-02-08 NOTE — Anesthesia Preprocedure Evaluation (Signed)
Anesthesia Evaluation  Patient identified by MRN, date of birth, ID band Patient awake    Reviewed: Allergy & Precautions, NPO status , Patient's Chart, lab work & pertinent test results  Airway Mallampati: II  TM Distance: >3 FB Neck ROM: Full    Dental no notable dental hx.    Pulmonary neg pulmonary ROS,  breath sounds clear to auscultation  Pulmonary exam normal       Cardiovascular hypertension, + angina negative cardio ROS Normal cardiovascular examRhythm:Regular Rate:Normal  No recent symptoms.  Can lie flat.  Mets4+   Neuro/Psych negative neurological ROS  negative psych ROS   GI/Hepatic negative GI ROS, Neg liver ROS, hiatal hernia,   Endo/Other  negative endocrine ROSdiabetes  Renal/GU negative Renal ROS  negative genitourinary   Musculoskeletal negative musculoskeletal ROS (+)   Abdominal   Peds negative pediatric ROS (+)  Hematology negative hematology ROS (+)   Anesthesia Other Findings   Reproductive/Obstetrics negative OB ROS                             Anesthesia Physical Anesthesia Plan  ASA: III  Anesthesia Plan: General and MAC   Post-op Pain Management:    Induction: Intravenous  Airway Management Planned:   Additional Equipment:   Intra-op Plan:   Post-operative Plan: Extubation in OR  Informed Consent: I have reviewed the patients History and Physical, chart, labs and discussed the procedure including the risks, benefits and alternatives for the proposed anesthesia with the patient or authorized representative who has indicated his/her understanding and acceptance.   Dental advisory given  Plan Discussed with: CRNA  Anesthesia Plan Comments:         Anesthesia Quick Evaluation

## 2015-02-09 ENCOUNTER — Encounter: Payer: Self-pay | Admitting: Ophthalmology

## 2015-04-19 DIAGNOSIS — M25879 Other specified joint disorders, unspecified ankle and foot: Secondary | ICD-10-CM | POA: Insufficient documentation

## 2015-04-28 ENCOUNTER — Other Ambulatory Visit: Payer: Self-pay | Admitting: Orthopaedic Surgery

## 2015-04-28 DIAGNOSIS — M25872 Other specified joint disorders, left ankle and foot: Secondary | ICD-10-CM

## 2015-04-28 DIAGNOSIS — M79672 Pain in left foot: Secondary | ICD-10-CM

## 2015-05-01 ENCOUNTER — Ambulatory Visit
Admission: RE | Admit: 2015-05-01 | Discharge: 2015-05-01 | Disposition: A | Discharge status: Home / Self Care | Payer: BLUE CROSS/BLUE SHIELD | Source: Ambulatory Visit | Attending: Orthopaedic Surgery | Admitting: Orthopaedic Surgery

## 2015-05-01 DIAGNOSIS — M79672 Pain in left foot: Secondary | ICD-10-CM | POA: Insufficient documentation

## 2015-05-01 DIAGNOSIS — M25872 Other specified joint disorders, left ankle and foot: Secondary | ICD-10-CM

## 2015-05-01 MED ORDER — GADOBENATE DIMEGLUMINE 529 MG/ML IV SOLN
20.0000 mL | Freq: Once | INTRAVENOUS | Status: AC | PRN
  Administered 2015-05-01: 20 mL via INTRAVENOUS

## 2015-05-10 ENCOUNTER — Ambulatory Visit: Payer: BLUE CROSS/BLUE SHIELD | Attending: Medical | Admitting: Physical Therapy

## 2015-05-10 DIAGNOSIS — M25672 Stiffness of left ankle, not elsewhere classified: Secondary | ICD-10-CM | POA: Insufficient documentation

## 2015-05-10 DIAGNOSIS — M6281 Muscle weakness (generalized): Secondary | ICD-10-CM

## 2015-05-10 DIAGNOSIS — M25572 Pain in left ankle and joints of left foot: Secondary | ICD-10-CM | POA: Diagnosis not present

## 2015-05-10 DIAGNOSIS — R262 Difficulty in walking, not elsewhere classified: Secondary | ICD-10-CM | POA: Insufficient documentation

## 2015-05-11 NOTE — Therapy (Signed)
[facility] [address] [place], [place], [address] Phone: [phone]   Fax:  [phone]  Physical Therapy Evaluation  Patient Details  Name: Diana Roberts MRN: [id number] Date of Birth: June 13, 1951 Referring Provider:  Ulysees Barns, *  Encounter Date: 05/10/2015      PT End of Session - 05/11/15 1406    Visit Number 1   Number of Visits 12   Date for PT Re-Evaluation 06/21/15   Activity Tolerance Patient tolerated treatment well;Patient limited by pain   Behavior During Therapy [facility] for tasks assessed/performed      Past Medical History  Diagnosis Date  . Diabetes mellitus without complication   . Hypertension   . Anxiety   . Headache     migraines  . Seasonal allergies   . History of hiatal hernia   . Arthritis   . Depression   . Hypercholesteremia   . Anginal pain   . Sleep apnea     Report - Care everywhere - 06/09/2014    Past Surgical History  Procedure Laterality Date  . Carpal tunnel release    . Cesarean section    . Hernia repair    . Knee arthroscopy    . Cataract extraction w/phaco Left 02/08/2015    Procedure: CATARACT EXTRACTION PHACO AND INTRAOCULAR LENS PLACEMENT (IOC);  Surgeon: Leandrew Koyanagi, MD;  Location: [place];  Service: Ophthalmology;  Laterality: Left;  DIABETIC    There were no vitals filed for this visit.  Visit Diagnosis:  Left ankle pain  Difficulty walking  Muscle weakness  Ankle joint stiffness, left      Subjective Assessment - 05/11/15 1403    Subjective Pt. reports 7/10 L lateral ankle/ distal gastroc pain.  Pt. reports R ankle has begun to hurt due to compensation during gait/ standing tasks.     Patient is accompained by: Family member   Limitations Standing;Walking;House hold activities;Other (comment)   Patient Stated Goals Decrease B lower leg/ankle pain with standing and walking tasks at home/ work.     Currently in Pain? Yes   Pain Score 7    Pain Location Ankle   Pain Orientation Left;Lower;Lateral   Pain Type Chronic pain   Aggravating Factors  prolonged standing/ work-related tasks.              [facility] PT Assessment - 05/11/15 0001    Assessment   Medical Diagnosis Mass of joint of ankle, left   Onset Date/Surgical Date 01/11/15   Precautions   Precautions None   Restrictions   Weight Bearing Restrictions No   Balance Screen   Has the patient fallen in the past 6 months No       OBJECTIVE:  Therex.:  See HEP (good technique with stretches)- cuing to avoid increase c/o pain.  Manual tx.: Supine L gastroc/soleus/hamstrig stretches 3x each.  STM to L lower leg/ ankle (assessment of L cyst/knot).      Pt response for medical necessity:  Moderate tenderness in L lower leg.  Marked improvement in walking with use of SPC in clinic.            PT Education - 05/11/15 1405    Education provided Yes   Education Details See HEP/ icing/ proper shoe wear (lift to heel).   Person(s) Educated Patient;Spouse   Methods Explanation;Demonstration;Tactile cues;Verbal cues;Handout   Comprehension Returned demonstration;Verbalized understanding;Need further instruction            PT  Long Term Goals - 05/11/15 1413    PT LONG TERM GOAL #1   Title Pt. I with HEP to increase L ankle AROM to WNL as compared to R ankle to improve pain-free mobility with standing/walking.     Time 4   Period Weeks   Status New   PT LONG TERM GOAL #2   Title Pt. will increase LEFS to >40 out of 80 to improve pain-free mobility.     Baseline 9/28  LEFS 24 out of 80.     Time 6   Period Weeks   Status New   PT LONG TERM GOAL #3   Title Pt. able to complete 8 hours of work as [employer] with no increase c/o L ankle pain.     Time 6   Period Weeks   Status New   PT LONG TERM GOAL #4   Title Pt. will ambulate with normalized gait pattern and no increase c/o L ankle pain to improve daily household activities/ work-related  tasks.     Baseline Moderate L antalgic gait pattern.    Time 6   Period Weeks   Status New               Plan - 05/11/15 1402    Clinical Impression Statement Pt. is a pleasant 64 y/o female with >3 months of R lateral ankle/ achilles tendon pain.  Pt. reports 7/10 R ankle pain with prolonged standing/walking tasks and increasing discomfort on R ankle due to overcompensation.  B LE muscle strenth: R quad 5/5 MMT, R hamstring 5/5 MMT, R hip flexion 4/5 MMT, R DF 5/5 MMT.  L quad 5/5 MMT, L hamstring 5/5 MMT, L hip flexion 5/5 MMT, R DF 5/5 MMT.  R hamstring flexibility 90 deg. (WNL).  L hamstring prox. 51 deg., distal WNL.  L DF AROM 8 deg. (muscle cramping in gastroc), L PF AROM 32 deg.  R DF AROM 12 deg., R PF AROM 32 deg.  R IV (30 deg.), EV (10 deg.).  L IV (32 deg.), EV (10 deg.).  Palpation: R medial gastroc medial head tenderness to palpation; L distal hamstring and lateral achilles tenderness.  Notable hard cyst/ knot on L distal, lateral achilles tendon (pain with palpation).  LEFS: 24 out of 80.  Pt. ambulates with L antalgic gait pattern without use of SPC with decresae stance time on L/ increase R swing through phase of gait.  Pt. benefits from use of SPC on R side with 2-point gait pattern with decrease pain and more normalized gait pattern.  Pt. will benefit from skilled PT services to increase L hamstring/ ankle ROM and strength to improve pain-free mobility/ walking.     Pt will benefit from skilled therapeutic intervention in order to improve on the following deficits Abnormal gait;Decreased endurance;Hypomobility;Obesity;Decreased activity tolerance;Decreased knowledge of use of DME;Decreased strength;Pain;Increased muscle spasms;Difficulty walking;Decreased mobility;Decreased range of motion;Improper body mechanics;Postural dysfunction;Impaired flexibility   Rehab Potential Good   PT Frequency 2x / week   PT Duration 6 weeks   PT Treatment/Interventions ADLs/Self Care Home  Management;Aquatic Therapy;Cryotherapy;Electrical Stimulation;Iontophoresis 4mg /ml Dexamethasone;Moist Heat;Balance training;Therapeutic exercise;Therapeutic activities;Functional mobility training;Stair training;Gait training;DME Instruction;Contrast Bath;Ultrasound;Neuromuscular re-education;Patient/family education;Manual techniques;Taping;Dry needling;Passive range of motion   PT Next Visit Plan Review HEP, assess work shoes, improve gait pattern with use of SPC.   PT Home Exercise Plan See HEP   Consulted and Agree with Plan of Care Patient         Problem List There are no  active problems to display for this patient.  Pura Spice, PT, DPT # [phone]   05/11/2015, 2:22 PM  [facility] [address] [place], [place], [address] Phone: [phone]   Fax:  [phone]

## 2015-05-15 ENCOUNTER — Encounter: Payer: BLUE CROSS/BLUE SHIELD | Admitting: Physical Therapy

## 2015-05-17 ENCOUNTER — Encounter: Payer: Self-pay | Admitting: Physical Therapy

## 2015-05-17 ENCOUNTER — Ambulatory Visit: Payer: BLUE CROSS/BLUE SHIELD | Attending: Medical | Admitting: Physical Therapy

## 2015-05-17 DIAGNOSIS — M6281 Muscle weakness (generalized): Secondary | ICD-10-CM | POA: Diagnosis present

## 2015-05-17 DIAGNOSIS — M25672 Stiffness of left ankle, not elsewhere classified: Secondary | ICD-10-CM | POA: Diagnosis present

## 2015-05-17 DIAGNOSIS — R262 Difficulty in walking, not elsewhere classified: Secondary | ICD-10-CM | POA: Diagnosis present

## 2015-05-17 DIAGNOSIS — M25572 Pain in left ankle and joints of left foot: Secondary | ICD-10-CM | POA: Diagnosis present

## 2015-05-17 NOTE — Therapy (Signed)
[facility] [address] [place], [place], [address] Phone: [phone]   Fax:  [phone]  Physical Therapy Treatment  Patient Details  Name: Diana Roberts MRN: [id number] Date of Birth: [birth year] Referring Provider:  Ulysees Barns, *  Encounter Date: 05/17/2015      PT End of Session - 05/17/15 1651    Visit Number 2   Number of Visits 12   Date for PT Re-Evaluation 06/21/15   PT Start Time 0855   PT Stop Time 0945   PT Time Calculation (min) 50 min   Activity Tolerance Patient tolerated treatment well;Patient limited by pain   Behavior During Therapy [facility] for tasks assessed/performed      Past Medical History  Diagnosis Date  . Diabetes mellitus without complication ([place])   . Hypertension   . Anxiety   . Headache     migraines  . Seasonal allergies   . History of hiatal hernia   . Arthritis   . Depression   . Hypercholesteremia   . Anginal pain ([place])   . Sleep apnea     Report - Care everywhere - 06/09/2014    Past Surgical History  Procedure Laterality Date  . Carpal tunnel release    . Cesarean section    . Hernia repair    . Knee arthroscopy    . Cataract extraction w/phaco Left 02/08/2015    Procedure: CATARACT EXTRACTION PHACO AND INTRAOCULAR LENS PLACEMENT (IOC);  Surgeon: Leandrew Koyanagi, MD;  Location: [place];  Service: Ophthalmology;  Laterality: Left;  DIABETIC    There were no vitals filed for this visit.  Visit Diagnosis:  Left ankle pain  Difficulty walking  Muscle weakness  Ankle joint stiffness, left      Subjective Assessment - 05/17/15 1649    Subjective Pt reports no pain upon arrival to PT tx session. Pt states she has had pain with standing at work over the past couple of days. Pt missed last schedule appointment due to illness.   Patient is accompained by: Family member   Limitations Standing;Walking;House hold activities;Other (comment)   Patient  Stated Goals Decrease B lower leg/ankle pain with standing and walking tasks at home/ work.     Currently in Pain? No/denies        OBJECTIVE: There.ex.: standing prostretch 5x each with 30 sec. Holds in //-bars.  Reviewed HEP.  Standing hamstring stretches 3x each on L/R.   Manual: Supine L and R STM to B gastroc./ achilles tendon 13 min.  Soft tissue release technique to L distal achilles around ?cyst region.     Pt response to Tx for medical necessity:  Palpable tenderness in L achilles with increase progression in L DF/ flexibility.  Improved gait pattern with use of SPC in clinic and addition of heel lifts.         PT Long Term Goals - 05/11/15 1413    PT LONG TERM GOAL #1   Title Pt. I with HEP to increase L ankle AROM to WNL as compared to R ankle to improve pain-free mobility with standing/walking.     Time 4   Period Weeks   Status New   PT LONG TERM GOAL #2   Title Pt. will increase LEFS to >40 out of 80 to improve pain-free mobility.     Baseline 9/28  LEFS 24 out of 80.     Time 6   Period Weeks   Status New  PT LONG TERM GOAL #3   Title Pt. able to complete 8 hours of work as [employer] with no increase c/o L ankle pain.     Time 6   Period Weeks   Status New   PT LONG TERM GOAL #4   Title Pt. will ambulate with normalized gait pattern and no increase c/o L ankle pain to improve daily household activities/ work-related tasks.     Baseline Moderate L antalgic gait pattern.    Time 6   Period Weeks   Status New            Plan - 05/17/15 1652    Clinical Impression Statement Palpable tenderness to L distal achilles tendon (?cyst) with STM/ release technique.  Increase L DF noted after gastroc/LE stretching ex. program.  Pt. reports no increase c/o pain with addition of B inner shoe heel lifts (1 cm.) each.     Pt will benefit from skilled therapeutic intervention in order to improve on the following deficits Abnormal gait;Decreased  endurance;Hypomobility;Obesity;Decreased activity tolerance;Decreased knowledge of use of DME;Decreased strength;Pain;Increased muscle spasms;Difficulty walking;Decreased mobility;Decreased range of motion;Improper body mechanics;Postural dysfunction;Impaired flexibility   Rehab Potential Good   PT Frequency 2x / week   PT Duration 6 weeks   PT Treatment/Interventions ADLs/Self Care Home Management;Aquatic Therapy;Cryotherapy;Electrical Stimulation;Iontophoresis 4mg /ml Dexamethasone;Moist Heat;Balance training;Therapeutic exercise;Therapeutic activities;Functional mobility training;Stair training;Gait training;DME Instruction;Contrast Bath;Ultrasound;Neuromuscular re-education;Patient/family education;Manual techniques;Taping;Dry needling;Passive range of motion   PT Next Visit Plan Review HEP, improve gait pattern with use of SPC. Recheck use of heel lifts and discuss SPC.    PT Home Exercise Plan See HEP   Consulted and Agree with Plan of Care Patient        Problem List There are no active problems to display for this patient.  Pura Spice, PT, DPT # [phone]   05/18/2015, 11:53 AM  [facility] [address] [place], [place], [address] Phone: [phone]   Fax:  [phone]

## 2015-05-23 ENCOUNTER — Ambulatory Visit: Payer: BLUE CROSS/BLUE SHIELD | Admitting: Physical Therapy

## 2015-05-23 DIAGNOSIS — M25572 Pain in left ankle and joints of left foot: Secondary | ICD-10-CM | POA: Diagnosis not present

## 2015-05-23 DIAGNOSIS — R262 Difficulty in walking, not elsewhere classified: Secondary | ICD-10-CM

## 2015-05-23 DIAGNOSIS — M6281 Muscle weakness (generalized): Secondary | ICD-10-CM

## 2015-05-23 DIAGNOSIS — M25672 Stiffness of left ankle, not elsewhere classified: Secondary | ICD-10-CM

## 2015-05-24 ENCOUNTER — Encounter: Payer: Self-pay | Admitting: Physical Therapy

## 2015-05-24 NOTE — Therapy (Signed)
[facility] [address] [place], [place], [address] Phone: [phone]   Fax:  [phone]  Physical Therapy Treatment  Patient Details  Name: Diana Roberts MRN: [id number] Date of Birth: 03/24/1951 Referring Provider:  Ulysees Barns, *  Encounter Date: 05/23/2015      PT End of Session - 05/24/15 0716    Visit Number 3   Number of Visits 12   Date for PT Re-Evaluation 06/21/15   PT Start Time 1629   PT Stop Time 7062   PT Time Calculation (min) 46 min   Activity Tolerance Patient tolerated treatment well;Patient limited by pain;No increased pain   Behavior During Therapy [facility] for tasks assessed/performed      Past Medical History  Diagnosis Date  . Diabetes mellitus without complication ([place])   . Hypertension   . Anxiety   . Headache     migraines  . Seasonal allergies   . History of hiatal hernia   . Arthritis   . Depression   . Hypercholesteremia   . Anginal pain ([place])   . Sleep apnea     Report - Care everywhere - 06/09/2014    Past Surgical History  Procedure Laterality Date  . Carpal tunnel release    . Cesarean section    . Hernia repair    . Knee arthroscopy    . Cataract extraction w/phaco Left 02/08/2015    Procedure: CATARACT EXTRACTION PHACO AND INTRAOCULAR LENS PLACEMENT (IOC);  Surgeon: Leandrew Koyanagi, MD;  Location: [place];  Service: Ophthalmology;  Laterality: Left;  DIABETIC    There were no vitals filed for this visit.  Visit Diagnosis:  No diagnosis found.      Subjective Assessment - 05/24/15 0714    Subjective Pt reports pain all over her body except for B knees. Pt came from work to PT appt today. Pt arrived with SPC and L wrist splint for dequervains.    Patient is accompained by: Family member   Limitations Standing;Walking;House hold activities;Other (comment)   Patient Stated Goals Decrease B lower leg/ankle pain with standing and walking tasks at  home/ work.     Currently in Pain? Yes   Pain Score 5    Pain Location Ankle   Pain Orientation Left   Pain Descriptors / Indicators Constant;Tightness;Pressure   Pain Type Chronic pain         OBJECTIVE: Manual: Supine L and R STM to B gastroc./ achilles tendon 20 min.Passive stretching to B gastrocs. Grade II subtalar joint mobilizations to aid in dorsiflexion 4 x 20 seconds bilateral (pt tender and unable to tolerate grade III). Ice in supine to L achilles to end session. Heel lift decreased by one lift pad.   Pt response to tx for medical necessity: Pt benefits from soft tissue mobilization and passive stretching to decrease pain with standing and work related tasks. Pt benefits from joint mobilization to increase pain free ROM.         PT Long Term Goals - 05/11/15 1413    PT LONG TERM GOAL #1   Title Pt. I with HEP to increase L ankle AROM to WNL as compared to R ankle to improve pain-free mobility with standing/walking.     Time 4   Period Weeks   Status New   PT LONG TERM GOAL #2   Title Pt. will increase LEFS to >40 out of 80 to improve pain-free mobility.     Baseline 9/28  LEFS 24 out of 80.     Time 6   Period Weeks   Status New   PT LONG TERM GOAL #3   Title Pt. able to complete 8 hours of work as [employer] with no increase c/o L ankle pain.     Time 6   Period Weeks   Status New   PT LONG TERM GOAL #4   Title Pt. will ambulate with normalized gait pattern and no increase c/o L ankle pain to improve daily household activities/ work-related tasks.     Baseline Moderate L antalgic gait pattern.    Time 6   Period Weeks   Status New            Plan - 05/24/15 0717    Clinical Impression Statement Pt reports discomfort from B shoe lift (1 cm) in the balls of her feet with prolonged standing. One layer was removed from her heel lift. Pt reports B tenderness with subtalar mobilizations to increase dorsiflexion (L worse than R). Pt has increased tenderness  with palpation and STM to bilateral gastrocs (L around cyst?, R medial head and distal attachment). Pt found relief with manual gastroc stretching to increase dorsiflexion.   Pt will benefit from skilled therapeutic intervention in order to improve on the following deficits Abnormal gait;Decreased endurance;Hypomobility;Obesity;Decreased activity tolerance;Decreased knowledge of use of DME;Decreased strength;Pain;Increased muscle spasms;Difficulty walking;Decreased mobility;Decreased range of motion;Improper body mechanics;Postural dysfunction;Impaired flexibility   Rehab Potential Good   PT Frequency 2x / week   PT Duration 6 weeks   PT Treatment/Interventions ADLs/Self Care Home Management;Aquatic Therapy;Cryotherapy;Electrical Stimulation;Iontophoresis 4mg /ml Dexamethasone;Moist Heat;Balance training;Therapeutic exercise;Therapeutic activities;Functional mobility training;Stair training;Gait training;DME Instruction;Contrast Bath;Ultrasound;Neuromuscular re-education;Patient/family education;Manual techniques;Taping;Dry needling;Passive range of motion   PT Next Visit Plan progress to strengthening/Reassess ROM   PT Home Exercise Plan See HEP   Consulted and Agree with Plan of Care Patient        Problem List There are no active problems to display for this patient.   Lavone Neri, SPT 05/24/2015, 7:38 AM  [facility] [address] [place], [place], [address] Phone: [phone]   Fax:  [phone]

## 2015-05-25 ENCOUNTER — Encounter: Payer: Self-pay | Admitting: Physical Therapy

## 2015-05-25 ENCOUNTER — Ambulatory Visit: Payer: BLUE CROSS/BLUE SHIELD | Admitting: Physical Therapy

## 2015-05-25 DIAGNOSIS — M25672 Stiffness of left ankle, not elsewhere classified: Secondary | ICD-10-CM

## 2015-05-25 DIAGNOSIS — M25572 Pain in left ankle and joints of left foot: Secondary | ICD-10-CM

## 2015-05-25 DIAGNOSIS — M6281 Muscle weakness (generalized): Secondary | ICD-10-CM

## 2015-05-25 DIAGNOSIS — R262 Difficulty in walking, not elsewhere classified: Secondary | ICD-10-CM

## 2015-05-25 NOTE — Therapy (Signed)
[facility] [address] [place], [place], [address] Phone: [phone]   Fax:  [phone]  Physical Therapy Treatment  Patient Details  Name: Diana Roberts MRN: [id number] Date of Birth: [birth year] No Data Recorded  Encounter Date: 05/25/2015      PT End of Session - 05/25/15 1757    Visit Number 4   Number of Visits 12   Date for PT Re-Evaluation 06/21/15   PT Start Time 1630   PT Stop Time 1712   PT Time Calculation (min) 42 min   Activity Tolerance Patient tolerated treatment well;Patient limited by pain;No increased pain   Behavior During Therapy [facility] for tasks assessed/performed      Past Medical History  Diagnosis Date  . Diabetes mellitus without complication ([place])   . Hypertension   . Anxiety   . Headache     migraines  . Seasonal allergies   . History of hiatal hernia   . Arthritis   . Depression   . Hypercholesteremia   . Anginal pain ([place])   . Sleep apnea     Report - Care everywhere - 06/09/2014    Past Surgical History  Procedure Laterality Date  . Carpal tunnel release    . Cesarean section    . Hernia repair    . Knee arthroscopy    . Cataract extraction w/phaco Left 02/08/2015    Procedure: CATARACT EXTRACTION PHACO AND INTRAOCULAR LENS PLACEMENT (IOC);  Surgeon: Leandrew Koyanagi, MD;  Location: [place];  Service: Ophthalmology;  Laterality: Left;  DIABETIC    There were no vitals filed for this visit.  Visit Diagnosis:  Left ankle pain  Difficulty walking  Muscle weakness  Ankle joint stiffness, left      Subjective Assessment - 05/25/15 1755    Subjective Pt reports being tired today. Pt states the balls of her feet are still giving her problems with the lifts in her shoes. Pt reports not wearing her shoes with the lifts in them to work today.    Patient is accompained by: Family member   Limitations Standing;Walking;House hold activities;Other (comment)    Patient Stated Goals Decrease B lower leg/ankle pain with standing and walking tasks at home/ work.     Currently in Pain? Yes   Pain Score 4    Pain Location Foot   Pain Orientation Left   Pain Type Chronic pain   Multiple Pain Sites Yes      OBJECTIVE: Manual: Supine L and R STM to B gastroc./ achilles tendon. Grade III subtalar joint mobilizations to aid in dorsiflexion 6 x 20 seconds bilateral. Grade III proximal fibular head mobilizations 4 x 20 seconds bilaterally. There ex: standing static gastroc stretching on the prostretch 4 x 30 seconds. Eccentric gastroc heel raises 10 x 2 (fatigue noted after 15).   Pt response to tx for medical necessity: Pt's pain free ROM increasing with joint mobilizations. Pt benefits from strengthening and soft tissue work to decrease pain with standing at work.        PT Education - 05/25/15 1757    Education provided Yes   Education Details Pt educated importance of stretching at home and given new strengthening exercise, eccentric gastroc heel raises.   Person(s) Educated Patient   Methods Explanation;Demonstration;Verbal cues   Comprehension Returned demonstration;Verbalized understanding             PT Long Term Goals - 05/11/15 1413    PT LONG TERM GOAL #  1   Title Pt. I with HEP to increase L ankle AROM to WNL as compared to R ankle to improve pain-free mobility with standing/walking.     Time 4   Period Weeks   Status New   PT LONG TERM GOAL #2   Title Pt. will increase LEFS to >40 out of 80 to improve pain-free mobility.     Baseline 9/28  LEFS 24 out of 80.     Time 6   Period Weeks   Status New   PT LONG TERM GOAL #3   Title Pt. able to complete 8 hours of work as [employer] with no increase c/o L ankle pain.     Time 6   Period Weeks   Status New   PT LONG TERM GOAL #4   Title Pt. will ambulate with normalized gait pattern and no increase c/o L ankle pain to improve daily household activities/ work-related  tasks.     Baseline Moderate L antalgic gait pattern.    Time 6   Period Weeks   Status New               Plan - 05/25/15 1758    Clinical Impression Statement Pt limited by fatigue and bilateral foot pain today. The lifts were removed from the patient's shoes for over the weekend. Pt fatigues with eccentric gastroc toe raises after 15 repitions. L gastroc medial tenderness present with palpation. Pt demonstrates joint stiffness and a hard end feel with grade III subtalar joint mobilizations.    Pt will benefit from skilled therapeutic intervention in order to improve on the following deficits Abnormal gait;Decreased endurance;Hypomobility;Obesity;Decreased activity tolerance;Decreased knowledge of use of DME;Decreased strength;Pain;Increased muscle spasms;Difficulty walking;Decreased mobility;Decreased range of motion;Improper body mechanics;Postural dysfunction;Impaired flexibility   Rehab Potential Good   PT Frequency 2x / week   PT Duration 6 weeks   PT Treatment/Interventions ADLs/Self Care Home Management;Aquatic Therapy;Cryotherapy;Electrical Stimulation;Iontophoresis 4mg /ml Dexamethasone;Moist Heat;Balance training;Therapeutic exercise;Therapeutic activities;Functional mobility training;Stair training;Gait training;DME Instruction;Contrast Bath;Ultrasound;Neuromuscular re-education;Patient/family education;Manual techniques;Taping;Dry needling;Passive range of motion   PT Next Visit Plan progress to strengthening/Reassess ROM   PT Home Exercise Plan See HEP   Consulted and Agree with Plan of Care Patient        Problem List There are no active problems to display for this patient.   Lavone Neri, SPT 05/25/2015, 6:01 PM  [place] [facility] [address] [place], [place], [address] Phone: [phone]   Fax:  [phone]  Name: Diana Roberts MRN: [id number] Date of Birth: 22-Jul-1951

## 2015-05-29 ENCOUNTER — Encounter: Payer: BLUE CROSS/BLUE SHIELD | Admitting: Physical Therapy

## 2015-05-31 ENCOUNTER — Encounter: Payer: BLUE CROSS/BLUE SHIELD | Admitting: Physical Therapy

## 2015-06-01 ENCOUNTER — Ambulatory Visit: Payer: BLUE CROSS/BLUE SHIELD | Admitting: Physical Therapy

## 2015-06-05 ENCOUNTER — Encounter: Payer: BLUE CROSS/BLUE SHIELD | Admitting: Physical Therapy

## 2015-06-06 ENCOUNTER — Ambulatory Visit: Payer: BLUE CROSS/BLUE SHIELD | Admitting: Physical Therapy

## 2015-06-06 ENCOUNTER — Encounter: Payer: Self-pay | Admitting: Physical Therapy

## 2015-06-06 ENCOUNTER — Encounter: Payer: BLUE CROSS/BLUE SHIELD | Admitting: Physical Therapy

## 2015-06-06 DIAGNOSIS — M25572 Pain in left ankle and joints of left foot: Secondary | ICD-10-CM

## 2015-06-06 DIAGNOSIS — M6281 Muscle weakness (generalized): Secondary | ICD-10-CM

## 2015-06-06 DIAGNOSIS — R262 Difficulty in walking, not elsewhere classified: Secondary | ICD-10-CM

## 2015-06-06 DIAGNOSIS — M25672 Stiffness of left ankle, not elsewhere classified: Secondary | ICD-10-CM

## 2015-06-06 NOTE — Therapy (Signed)
[facility] [address] [place], [place], [address] Phone: [phone]   Fax:  [phone]  Physical Therapy Treatment  Patient Details  Name: Diana Roberts MRN: [id number] Date of Birth: 21-May-1951 No Data Recorded  Encounter Date: 06/06/2015      PT End of Session - 06/06/15 1501    Visit Number 5   Number of Visits 12   Date for PT Re-Evaluation 06/21/15   PT Start Time 0810   PT Stop Time 0851   PT Time Calculation (min) 41 min   Activity Tolerance Patient tolerated treatment well;Patient limited by pain;No increased pain   Behavior During Therapy [facility] for tasks assessed/performed      Past Medical History  Diagnosis Date  . Diabetes mellitus without complication ([place])   . Hypertension   . Anxiety   . Headache     migraines  . Seasonal allergies   . History of hiatal hernia   . Arthritis   . Depression   . Hypercholesteremia   . Anginal pain ([place])   . Sleep apnea     Report - Care everywhere - 06/09/2014    Past Surgical History  Procedure Laterality Date  . Carpal tunnel release    . Cesarean section    . Hernia repair    . Knee arthroscopy    . Cataract extraction w/phaco Left 02/08/2015    Procedure: CATARACT EXTRACTION PHACO AND INTRAOCULAR LENS PLACEMENT (IOC);  Surgeon: Leandrew Koyanagi, MD;  Location: [place];  Service: Ophthalmology;  Laterality: Left;  DIABETIC    There were no vitals filed for this visit.  Visit Diagnosis:  Left ankle pain  Difficulty walking  Muscle weakness  Ankle joint stiffness, left      Subjective Assessment - 06/06/15 1500    Subjective Pt reports no new complaints or pain today. Pt states she has felt a general body ache all week and that is why she cancelled her appt last thursday. Pt reports balls of feet feeling better at work without lifts in her shoes. Pt states that she is experiencing ankle pain after standing all day at work.    Patient  is accompained by: Family member   Limitations Standing;Walking;House hold activities;Other (comment)   Patient Stated Goals Decrease B lower leg/ankle pain with standing and walking tasks at home/ work.     Currently in Pain? No/denies        OBJECTIVE: There ex: In the // bars: toe walking/heel walking x 6 each with B UE support. Resisted side steps x 10 with 2 BTB with B UE support. Resisted forward walking lunges x 10 with 2 BTB with B UE support. Eccentric gastroc control with heel raises with handrail support x 30. Manual: Stretching to B gastroc/soleus. Talocrual joint mobilizations, grade III 4 x 30 seconds B LEs. Distal fibular head mobilizations grade III 3 x 30 seconds B.   Pt response to Tx for medical necessity: Pt benefits from strengthening to increase endurance for standing at work. Pt benefits from manual therapy to increase pain-free functional mobility.         PT Long Term Goals - 05/11/15 1413    PT LONG TERM GOAL #1   Title Pt. I with HEP to increase L ankle AROM to WNL as compared to R ankle to improve pain-free mobility with standing/walking.     Time 4   Period Weeks   Status New   PT LONG TERM GOAL #2  Title Pt. will increase LEFS to >40 out of 80 to improve pain-free mobility.     Baseline 9/28  LEFS 24 out of 80.     Time 6   Period Weeks   Status New   PT LONG TERM GOAL #3   Title Pt. able to complete 8 hours of work as [employer] with no increase c/o L ankle pain.     Time 6   Period Weeks   Status New   PT LONG TERM GOAL #4   Title Pt. will ambulate with normalized gait pattern and no increase c/o L ankle pain to improve daily household activities/ work-related tasks.     Baseline Moderate L antalgic gait pattern.    Time 6   Period Weeks   Status New            Plan - 06/06/15 1502    Clinical Impression Statement Pt ambulates with SPC and decreased cadence. Pt demonstrates bilateral foot flat initial contact with ambulation. Pt has  mobility at her talocrural with grade III posterior glides without increased complaints of discomfort. Pt remains tight in her gastroc/achilles tendon but reports no tenderness to palpation. Noted that pt has decreased arch bilaterally and that her arches collaspse in a weight bearing position.   Pt will benefit from skilled therapeutic intervention in order to improve on the following deficits Abnormal gait;Decreased endurance;Hypomobility;Obesity;Decreased activity tolerance;Decreased knowledge of use of DME;Decreased strength;Pain;Increased muscle spasms;Difficulty walking;Decreased mobility;Decreased range of motion;Improper body mechanics;Postural dysfunction;Impaired flexibility   Rehab Potential Good   PT Frequency 2x / week   PT Duration 6 weeks   PT Treatment/Interventions ADLs/Self Care Home Management;Aquatic Therapy;Cryotherapy;Electrical Stimulation;Iontophoresis 4mg /ml Dexamethasone;Moist Heat;Balance training;Therapeutic exercise;Therapeutic activities;Functional mobility training;Stair training;Gait training;DME Instruction;Contrast Bath;Ultrasound;Neuromuscular re-education;Patient/family education;Manual techniques;Taping;Dry needling;Passive range of motion   PT Next Visit Plan reasasses ROM/TB strengthening/increase HEP/discuss aquatic or community wellness classes    PT Home Exercise Plan continue with current plan   Consulted and Agree with Plan of Care Patient        Problem List There are no active problems to display for this patient.  Pura Spice, PT, DPT # [phone]   06/07/2015, 9:34 AM  [place] [facility] [address] [place], [place], [address] Phone: [phone]   Fax:  [phone]  Name: Diana Roberts MRN: [id number] Date of Birth: Jul 06, 1951

## 2015-06-07 ENCOUNTER — Encounter: Payer: BLUE CROSS/BLUE SHIELD | Admitting: Physical Therapy

## 2015-06-08 ENCOUNTER — Ambulatory Visit: Payer: BLUE CROSS/BLUE SHIELD | Admitting: Physical Therapy

## 2015-06-08 ENCOUNTER — Encounter: Payer: Self-pay | Admitting: Physical Therapy

## 2015-06-08 DIAGNOSIS — M25672 Stiffness of left ankle, not elsewhere classified: Secondary | ICD-10-CM

## 2015-06-08 DIAGNOSIS — M6281 Muscle weakness (generalized): Secondary | ICD-10-CM

## 2015-06-08 DIAGNOSIS — M25572 Pain in left ankle and joints of left foot: Secondary | ICD-10-CM | POA: Diagnosis not present

## 2015-06-08 DIAGNOSIS — R262 Difficulty in walking, not elsewhere classified: Secondary | ICD-10-CM

## 2015-06-08 NOTE — Therapy (Signed)
[facility] [address] [place], [place], [address] Phone: [phone]   Fax:  [phone]  Physical Therapy Treatment  Patient Details  Name: Diana Roberts MRN: [id number] Date of Birth: 06-01-1951 No Data Recorded  Encounter Date: 06/08/2015      PT End of Session - 06/08/15 2237    Visit Number 6   Number of Visits 12   Date for PT Re-Evaluation 06/21/15   PT Start Time 1626   PT Stop Time 1324   PT Time Calculation (min) 49 min   Activity Tolerance Patient tolerated treatment well;No increased pain   Behavior During Therapy [facility] for tasks assessed/performed      Past Medical History  Diagnosis Date  . Diabetes mellitus without complication ([place])   . Hypertension   . Anxiety   . Headache     migraines  . Seasonal allergies   . History of hiatal hernia   . Arthritis   . Depression   . Hypercholesteremia   . Anginal pain ([place])   . Sleep apnea     Report - Care everywhere - 06/09/2014    Past Surgical History  Procedure Laterality Date  . Carpal tunnel release    . Cesarean section    . Hernia repair    . Knee arthroscopy    . Cataract extraction w/phaco Left 02/08/2015    Procedure: CATARACT EXTRACTION PHACO AND INTRAOCULAR LENS PLACEMENT (IOC);  Surgeon: Leandrew Koyanagi, MD;  Location: [place];  Service: Ophthalmology;  Laterality: Left;  DIABETIC    There were no vitals filed for this visit.  Visit Diagnosis:  Left ankle pain  Difficulty walking  Muscle weakness  Ankle joint stiffness, left      Subjective Assessment - 06/08/15 2236    Subjective Pt reports R ball of foot pain with standing all day at work. Pt has pain in R lateral malleolus.    Patient is accompained by: Family member   Limitations Standing;Walking;House hold activities;Other (comment)   Patient Stated Goals Decrease B lower leg/ankle pain with standing and walking tasks at home/ work.     Pain Score 3    Pain Location Ankle   Pain Orientation Right   Pain Descriptors / Indicators Aching   Pain Type Chronic pain      OBJECTIVE: There ex: Forwards and backwards walking x 10. Heel and toe walking x 10 each in the // bars with B UE support. Resisted gait all 4 planes with 2 BTB x 10 each. Manual: supine stretching to B gastroc and distal hamstring (tightness noted with stretching). Ultrasound for 7 mins,  1.4 mHz for intensity, 1 cm for depth. Pt positioned in prone and ultrasound gel applied to B achilles tendon and gastroc muscles. Pt had no increase in symptoms or pain with ultrasound.   Pt response to Tx for medical necessity: Pt benefits from strengthening and proper postural corrective exercises in order to maintain her functional mobility. Pt is limited at her job with decreased standing tolerance and benefits from PT to transition her into pain-free work.           PT Long Term Goals - 05/11/15 1413    PT LONG TERM GOAL #1   Title Pt. I with HEP to increase L ankle AROM to WNL as compared to R ankle to improve pain-free mobility with standing/walking.     Time 4   Period Weeks   Status New   PT LONG  TERM GOAL #2   Title Pt. will increase LEFS to >40 out of 80 to improve pain-free mobility.     Baseline 9/28  LEFS 24 out of 80.     Time 6   Period Weeks   Status New   PT LONG TERM GOAL #3   Title Pt. able to complete 8 hours of work as [employer] with no increase c/o L ankle pain.     Time 6   Period Weeks   Status New   PT LONG TERM GOAL #4   Title Pt. will ambulate with normalized gait pattern and no increase c/o L ankle pain to improve daily household activities/ work-related tasks.     Baseline Moderate L antalgic gait pattern.    Time 6   Period Weeks   Status New               Plan - 06/08/15 2237    Clinical Impression Statement Pt ambulates with and without SPC in PT gym with variable cadence and decreased R stance time. Pt ambulates with decrease  stride length on R. Pt's pain get worse as the day goes on and has difficulty with standing exercises due to this pain. Pt found relief with ultrasound to B gastrocs. Pt continues to have gastroc tightness.    Pt will benefit from skilled therapeutic intervention in order to improve on the following deficits Abnormal gait;Decreased endurance;Hypomobility;Obesity;Decreased activity tolerance;Decreased knowledge of use of DME;Decreased strength;Pain;Increased muscle spasms;Difficulty walking;Decreased mobility;Decreased range of motion;Improper body mechanics;Postural dysfunction;Impaired flexibility   Rehab Potential Good   PT Frequency 2x / week   PT Duration 6 weeks   PT Treatment/Interventions ADLs/Self Care Home Management;Aquatic Therapy;Cryotherapy;Electrical Stimulation;Iontophoresis 4mg /ml Dexamethasone;Moist Heat;Balance training;Therapeutic exercise;Therapeutic activities;Functional mobility training;Stair training;Gait training;DME Instruction;Contrast Bath;Ultrasound;Neuromuscular re-education;Patient/family education;Manual techniques;Taping;Dry needling;Passive range of motion   PT Next Visit Plan aquatic therapy/strengthening/increase standing tolerance   PT Home Exercise Plan continue with current plan   Consulted and Agree with Plan of Care Patient        Problem List There are no active problems to display for this patient.   Burke Keels Ineze Serrao,SPT 06/08/2015, 10:40 PM   [facility] [address] [place], [place], [address] Phone: [phone]   Fax:  [phone]  Name: Diana Roberts MRN: [id number] Date of Birth: [birth year]

## 2015-06-12 ENCOUNTER — Encounter: Payer: BLUE CROSS/BLUE SHIELD | Admitting: Physical Therapy

## 2015-06-12 DIAGNOSIS — M25572 Pain in left ankle and joints of left foot: Secondary | ICD-10-CM | POA: Insufficient documentation

## 2015-06-13 ENCOUNTER — Encounter: Payer: BLUE CROSS/BLUE SHIELD | Admitting: Physical Therapy

## 2015-06-14 ENCOUNTER — Encounter: Payer: BLUE CROSS/BLUE SHIELD | Admitting: Physical Therapy

## 2015-06-15 ENCOUNTER — Encounter: Payer: BLUE CROSS/BLUE SHIELD | Admitting: Physical Therapy

## 2015-07-05 ENCOUNTER — Other Ambulatory Visit: Payer: Self-pay | Admitting: Family Medicine

## 2015-07-05 DIAGNOSIS — Z1231 Encounter for screening mammogram for malignant neoplasm of breast: Secondary | ICD-10-CM

## 2015-07-13 ENCOUNTER — Ambulatory Visit
Admission: RE | Admit: 2015-07-13 | Discharge: 2015-07-13 | Disposition: A | Discharge status: Home / Self Care | Payer: BLUE CROSS/BLUE SHIELD | Source: Ambulatory Visit | Attending: Family Medicine | Admitting: Family Medicine

## 2015-07-13 DIAGNOSIS — Z1231 Encounter for screening mammogram for malignant neoplasm of breast: Secondary | ICD-10-CM | POA: Diagnosis not present

## 2015-07-14 ENCOUNTER — Emergency Department: Payer: BLUE CROSS/BLUE SHIELD

## 2015-07-14 ENCOUNTER — Encounter: Payer: Self-pay | Admitting: *Deleted

## 2015-07-14 ENCOUNTER — Emergency Department
Admission: EM | Admit: 2015-07-14 | Discharge: 2015-07-14 | Disposition: A | Discharge status: Home / Self Care | Payer: BLUE CROSS/BLUE SHIELD | Attending: Emergency Medicine | Admitting: Emergency Medicine

## 2015-07-14 DIAGNOSIS — R05 Cough: Secondary | ICD-10-CM | POA: Diagnosis not present

## 2015-07-14 DIAGNOSIS — R079 Chest pain, unspecified: Secondary | ICD-10-CM | POA: Diagnosis not present

## 2015-07-14 DIAGNOSIS — R110 Nausea: Secondary | ICD-10-CM | POA: Diagnosis not present

## 2015-07-14 DIAGNOSIS — I10 Essential (primary) hypertension: Secondary | ICD-10-CM | POA: Diagnosis not present

## 2015-07-14 DIAGNOSIS — E119 Type 2 diabetes mellitus without complications: Secondary | ICD-10-CM | POA: Insufficient documentation

## 2015-07-14 LAB — COMPREHENSIVE METABOLIC PANEL
ALT: 39 U/L (ref 14–54)
AST: 35 U/L (ref 15–41)
Albumin: 4.4 g/dL (ref 3.5–5.0)
Alkaline Phosphatase: 81 U/L (ref 38–126)
Anion gap: 9 (ref 5–15)
BUN: 34 mg/dL — AB (ref 6–20)
CHLORIDE: 105 mmol/L (ref 101–111)
CO2: 26 mmol/L (ref 22–32)
Calcium: 10.2 mg/dL (ref 8.9–10.3)
Creatinine, Ser: 0.77 mg/dL (ref 0.44–1.00)
GFR calc Af Amer: >60 mL/min (ref >60)
Glucose, Bld: 227 mg/dL — ABNORMAL HIGH (ref 65–99)
POTASSIUM: 5.5 mmol/L — AB (ref 3.5–5.1)
SODIUM: 140 mmol/L (ref 135–145)
Total Bilirubin: 0.2 mg/dL — ABNORMAL LOW (ref 0.3–1.2)
Total Protein: 7.5 g/dL (ref 6.5–8.1)

## 2015-07-14 LAB — CBC
HCT: 39.6 % (ref 35.0–47.0)
Hemoglobin: 13.4 g/dL (ref 12.0–16.0)
MCH: 30.8 pg (ref 26.0–34.0)
MCHC: 33.7 g/dL (ref 32.0–36.0)
MCV: 91.4 fL (ref 80.0–100.0)
PLATELETS: 260 10*3/uL (ref 150–440)
RBC: 4.34 MIL/uL (ref 3.80–5.20)
RDW: 12.6 % (ref 11.5–14.5)
WBC: 9.6 10*3/uL (ref 3.6–11.0)

## 2015-07-14 LAB — TROPONIN I

## 2015-07-14 MED ORDER — ASPIRIN 81 MG PO CHEW
324.0000 mg | CHEWABLE_TABLET | Freq: Once | ORAL | Status: AC
  Administered 2015-07-14: 324 mg via ORAL
  Filled 2015-07-14: qty 4

## 2015-07-14 NOTE — ED Notes (Signed)
Pt discharged home after verbalizing understanding of discharge instructions; nad noted. 

## 2015-07-14 NOTE — ED Provider Notes (Signed)
[facility] Emergency Department Provider Note     Time seen: ----------------------------------------- 9:33 AM on 07/14/2015 -----------------------------------------    I have reviewed the triage vital signs and the nursing notes.   HISTORY  Chief Complaint Chest Pain    HPI Diana Roberts is a 64 y.o. female who presents ER for sudden onset left-sided chest pain under her left breast. Patient states that sharp, nothing makes it better or worse. Pain comes and goes, she was nauseated this morning, she has had some productive cough as well. Chest pain started while she was at work.   Past Medical History  Diagnosis Date  . Diabetes mellitus without complication ([place])   . Hypertension   . Anxiety   . Headache     migraines  . Seasonal allergies   . History of hiatal hernia   . Arthritis   . Depression   . Hypercholesteremia   . Anginal pain ([place])   . Sleep apnea     Report - Care everywhere - 06/09/2014    There are no active problems to display for this patient.   Past Surgical History  Procedure Laterality Date  . Carpal tunnel release    . Cesarean section    . Hernia repair    . Knee arthroscopy    . Cataract extraction w/phaco Left 02/08/2015    Procedure: CATARACT EXTRACTION PHACO AND INTRAOCULAR LENS PLACEMENT (IOC);  Surgeon: Leandrew Koyanagi, MD;  Location: [place];  Service: Ophthalmology;  Laterality: Left;  DIABETIC    Allergies Ciprofloxacin and Tylenol sinus severe  Social History Social History  Substance Use Topics  . Smoking status: Never Smoker   . Smokeless tobacco: None  . Alcohol Use: No    Review of Systems Constitutional: Negative for fever. Eyes: Negative for visual changes. ENT: Negative for sore throat. Cardiovascular: Positive for chest pain Respiratory: Negative for shortness of breath. Gastrointestinal: Negative for abdominal pain, positive for nausea Genitourinary: Negative  for dysuria. Musculoskeletal: Negative for back pain. Skin: Negative for rash. Neurological: Negative for headaches, focal weakness or numbness.  10-point ROS otherwise negative.  ____________________________________________   PHYSICAL EXAM:  VITAL SIGNS: ED Triage Vitals  Enc Vitals Group     BP 07/14/15 0928 121/83 mmHg     Pulse Rate 07/14/15 0928 63     Resp 07/14/15 0928 18     Temp 07/14/15 0928 98.8 F (37.1 C)     Temp Source 07/14/15 0928 Oral     SpO2 07/14/15 0926 98 %     Weight 07/14/15 0928 202 lb (91.627 kg)     Height 07/14/15 0928 4\' 10"  (1.473 m)     Head Cir --      Peak Flow --      Pain Score 07/14/15 0929 4     Pain Loc --      Pain Edu? --      Excl. in [place]? --     Constitutional: Alert and oriented. Well appearing and in no distress. Eyes: Conjunctivae are normal. PERRL. Normal extraocular movements. ENT   Head: Normocephalic and atraumatic.   Nose: No congestion/rhinnorhea.   Mouth/Throat: Mucous membranes are moist.   Neck: No stridor. Cardiovascular: Normal rate, regular rhythm. Normal and symmetric distal pulses are present in all extremities. No murmurs, rubs, or gallops. Respiratory: Normal respiratory effort without tachypnea nor retractions. Breath sounds are clear and equal bilaterally. No wheezes/rales/rhonchi. Gastrointestinal: Soft and nontender. No distention. No abdominal bruits.  Musculoskeletal: Nontender  with normal range of motion in all extremities. No joint effusions.  No lower extremity tenderness nor edema. Neurologic:  Normal speech and language. No gross focal neurologic deficits are appreciated. Speech is normal. No gait instability. Skin:  Skin is warm, dry and intact. No rash noted. Psychiatric: Mood and affect are normal. Speech and behavior are normal. Patient exhibits appropriate insight and judgment. ____________________________________________  EKG: Interpreted by me. Normal sinus rhythm with normal  axis, normal intervals. There is a PVC present, possible septal infarct age indeterminate.  ____________________________________________  ED COURSE:  Pertinent labs & imaging results that were available during my care of the patient were reviewed by me and considered in my medical decision making (see chart for details). Patient is no acute distress, this appears to be musculoskeletal however we'll check cardiac labs and maybe a second troponin. ____________________________________________    LABS (pertinent positives/negatives)  Labs Reviewed  COMPREHENSIVE METABOLIC PANEL - Abnormal; Notable for the following:    Potassium 5.5 (*)    Glucose, Bld 227 (*)    BUN 34 (*)    Total Bilirubin 0.2 (*)    All other components within normal limits  CBC  TROPONIN I  TROPONIN I    RADIOLOGY Images were viewed by me  Chest x-ray IMPRESSION: No edema or consolidation. ____________________________________________  FINAL ASSESSMENT AND PLAN  Chest pain  Plan: Patient with labs and imaging as dictated above. Patient is in no acute distress, serial troponins are negative here. She is currently low risk according to heart score. Advised to follow-up with her cardiologist on Monday for reevaluation.   Earleen Newport, MD   Earleen Newport, MD 07/14/15 [id number]

## 2015-07-14 NOTE — Discharge Instructions (Signed)
Nonspecific Chest Pain  °Chest pain can be caused by many different conditions. There is always a chance that your pain could be related to something serious, such as a heart attack or a blood clot in your lungs. Chest pain can also be caused by conditions that are not life-threatening. If you have chest pain, it is very important to follow up with your health care provider. °CAUSES  °Chest pain can be caused by: °· Heartburn. °· Pneumonia or bronchitis. °· Anxiety or stress. °· Inflammation around your heart (pericarditis) or lung (pleuritis or pleurisy). °· A blood clot in your lung. °· A collapsed lung (pneumothorax). It can develop suddenly on its own (spontaneous pneumothorax) or from trauma to the chest. °· Shingles infection (varicella-zoster virus). °· Heart attack. °· Damage to the bones, muscles, and cartilage that make up your chest wall. This can include: °¨ Bruised bones due to injury. °¨ Strained muscles or cartilage due to frequent or repeated coughing or overwork. °¨ Fracture to one or more ribs. °¨ Sore cartilage due to inflammation (costochondritis). °RISK FACTORS  °Risk factors for chest pain may include: °· Activities that increase your risk for trauma or injury to your chest. °· Respiratory infections or conditions that cause frequent coughing. °· Medical conditions or overeating that can cause heartburn. °· Heart disease or family history of heart disease. °· Conditions or health behaviors that increase your risk of developing a blood clot. °· Having had chicken pox (varicella zoster). °SIGNS AND SYMPTOMS °Chest pain can feel like: °· Burning or tingling on the surface of your chest or deep in your chest. °· Crushing, pressure, aching, or squeezing pain. °· Dull or sharp pain that is worse when you move, cough, or take a deep breath. °· Pain that is also felt in your back, neck, shoulder, or arm, or pain that spreads to any of these areas. °Your chest pain may come and go, or it may stay  constant. °DIAGNOSIS °Lab tests or other studies may be needed to find the cause of your pain. Your health care provider may have you take a test called an ambulatory ECG (electrocardiogram). An ECG records your heartbeat patterns at the time the test is performed. You may also have other tests, such as: °· Transthoracic echocardiogram (TTE). During echocardiography, sound waves are used to create a picture of all of the heart structures and to look at how blood flows through your heart. °· Transesophageal echocardiogram (TEE). This is a more advanced imaging test that obtains images from inside your body. It allows your health care provider to see your heart in finer detail. °· Cardiac monitoring. This allows your health care provider to monitor your heart rate and rhythm in real time. °· Holter monitor. This is a portable device that records your heartbeat and can help to diagnose abnormal heartbeats. It allows your health care provider to track your heart activity for several days, if needed. °· Stress tests. These can be done through exercise or by taking medicine that makes your heart beat more quickly. °· Blood tests. °· Imaging tests. °TREATMENT  °Your treatment depends on what is causing your chest pain. Treatment may include: °· Medicines. These may include: °¨ Acid blockers for heartburn. °¨ Anti-inflammatory medicine. °¨ Pain medicine for inflammatory conditions. °¨ Antibiotic medicine, if an infection is present. °¨ Medicines to dissolve blood clots. °¨ Medicines to treat coronary artery disease. °· Supportive care for conditions that do not require medicines. This may include: °¨ Resting. °¨ Applying heat   or cold packs to injured areas. °¨ Limiting activities until pain decreases. °HOME CARE INSTRUCTIONS °· If you were prescribed an antibiotic medicine, finish it all even if you start to feel better. °· Avoid any activities that bring on chest pain. °· Do not use any tobacco products, including  cigarettes, chewing tobacco, or electronic cigarettes. If you need help quitting, ask your health care provider. °· Do not drink alcohol. °· Take medicines only as directed by your health care provider. °· Keep all follow-up visits as directed by your health care provider. This is important. This includes any further testing if your chest pain does not go away. °· If heartburn is the cause for your chest pain, you may be told to keep your head raised (elevated) while sleeping. This reduces the chance that acid will go from your stomach into your esophagus. °· Make lifestyle changes as directed by your health care provider. These may include: °¨ Getting regular exercise. Ask your health care provider to suggest some activities that are safe for you. °¨ Eating a heart-healthy diet. A registered dietitian can help you to learn healthy eating options. °¨ Maintaining a healthy weight. °¨ Managing diabetes, if necessary. °¨ Reducing stress. °SEEK MEDICAL CARE IF: °· Your chest pain does not go away after treatment. °· You have a rash with blisters on your chest. °· You have a fever. °SEEK IMMEDIATE MEDICAL CARE IF:  °· Your chest pain is worse. °· You have an increasing cough, or you cough up blood. °· You have severe abdominal pain. °· You have severe weakness. °· You faint. °· You have chills. °· You have sudden, unexplained chest discomfort. °· You have sudden, unexplained discomfort in your arms, back, neck, or jaw. °· You have shortness of breath at any time. °· You suddenly start to sweat, or your skin gets clammy. °· You feel nauseous or you vomit. °· You suddenly feel light-headed or dizzy. °· Your heart begins to beat quickly, or it feels like it is skipping beats. °These symptoms may represent a serious problem that is an emergency. Do not wait to see if the symptoms will go away. Get medical help right away. Call your local emergency services (911 in the [place] Do not drive yourself to the hospital. °  °This  information is not intended to replace advice given to you by your health care provider. Make sure you discuss any questions you have with your health care provider. °  °Document Released: 05/08/2005 Document Revised: 08/19/2014 Document Reviewed: 03/04/2014 °Elsevier Interactive Patient Education ©2016 [employer] ° °

## 2015-07-14 NOTE — ED Notes (Addendum)
Pt [facility] via EMS from [facility] with sudden onset of left sided chest pain under her breast, states sharp in nature, pt states pain comes and goes, pt awake and alert upon arrival in no distress, states nausea, pt also states a productive cough of clear sputum that started today

## 2015-07-18 ENCOUNTER — Telehealth: Payer: Self-pay

## 2015-07-18 NOTE — Telephone Encounter (Signed)
l msg with pt spouse to call and schedule ED f/u

## 2015-08-15 ENCOUNTER — Telehealth: Payer: Self-pay

## 2015-08-15 NOTE — Telephone Encounter (Signed)
Left msg with pt brother in law to have pt return the call to schedule for ED F/U.

## 2015-09-20 ENCOUNTER — Encounter: Payer: Self-pay | Admitting: Emergency Medicine

## 2015-09-20 ENCOUNTER — Ambulatory Visit
Admission: EM | Admit: 2015-09-20 | Discharge: 2015-09-20 | Disposition: A | Discharge status: Home / Self Care | Payer: BLUE CROSS/BLUE SHIELD | Attending: Family Medicine | Admitting: Family Medicine

## 2015-09-20 DIAGNOSIS — H66003 Acute suppurative otitis media without spontaneous rupture of ear drum, bilateral: Secondary | ICD-10-CM

## 2015-09-20 DIAGNOSIS — J209 Acute bronchitis, unspecified: Secondary | ICD-10-CM | POA: Diagnosis not present

## 2015-09-20 DIAGNOSIS — J0100 Acute maxillary sinusitis, unspecified: Secondary | ICD-10-CM

## 2015-09-20 MED ORDER — PREDNISONE 10 MG (21) PO TBPK: ORAL_TABLET | ORAL | Status: DC

## 2015-09-20 MED ORDER — HYDROCOD POLST-CPM POLST ER 10-8 MG/5ML PO SUER: 5.0000 mL | Freq: Two times a day (BID) | ORAL | Status: DC | PRN

## 2015-09-20 MED ORDER — IPRATROPIUM-ALBUTEROL 0.5-2.5 (3) MG/3ML IN SOLN
3.0000 mL | Freq: Once | RESPIRATORY_TRACT | Status: AC
  Administered 2015-09-20: 3 mL via RESPIRATORY_TRACT

## 2015-09-20 MED ORDER — CEFUROXIME AXETIL 500 MG PO TABS: 500.0000 mg | ORAL_TABLET | Freq: Two times a day (BID) | ORAL | Status: DC

## 2015-09-20 NOTE — Discharge Instructions (Signed)
Acute Bronchitis Bronchitis is when the airways that extend from the windpipe into the lungs get red, puffy, and painful (inflamed). Bronchitis often causes thick spit (mucus) to develop. This leads to a cough. A cough is the most common symptom of bronchitis. In acute bronchitis, the condition usually begins suddenly and goes away over time (usually in 2 weeks). Smoking, allergies, and asthma can make bronchitis worse. Repeated episodes of bronchitis may cause more lung problems. HOME CARE  Rest.  Drink enough fluids to keep your pee (urine) clear or pale yellow (unless you need to limit fluids as told by your doctor).  Only take over-the-counter or prescription medicines as told by your doctor.  Avoid smoking and secondhand smoke. These can make bronchitis worse. If you are a smoker, think about using nicotine gum or skin patches. Quitting smoking will help your lungs heal faster.  Reduce the chance of getting bronchitis again by:  Washing your hands often.  Avoiding people with cold symptoms.  Trying not to touch your hands to your mouth, nose, or eyes.  Follow up with your doctor as told. GET HELP IF: Your symptoms do not improve after 1 week of treatment. Symptoms include:  Cough.  Fever.  Coughing up thick spit.  Body aches.  Chest congestion.  Chills.  Shortness of breath.  Sore throat. GET HELP RIGHT AWAY IF:   You have an increased fever.  You have chills.  You have severe shortness of breath.  You have bloody thick spit (sputum).  You throw up (vomit) often.  You lose too much body fluid (dehydration).  You have a severe headache.  You faint. MAKE SURE YOU:   Understand these instructions.  Will watch your condition.  Will get help right away if you are not doing well or get worse.   This information is not intended to replace advice given to you by your health care provider. Make sure you discuss any questions you have with your health care  provider.   Document Released: 01/15/2008 Document Revised: 03/31/2013 Document Reviewed: 01/19/2013 Elsevier Interactive Patient Education 2016 [employer].  Otitis Media, Adult Otitis media is redness, soreness, and puffiness (swelling) in the space just behind your eardrum (middle ear). It may be caused by allergies or infection. It often happens along with a cold. HOME CARE  Take your medicine as told. Finish it even if you start to feel better.  Only take over-the-counter or prescription medicines for pain, discomfort, or fever as told by your doctor.  Follow up with your doctor as told. GET HELP IF:  You have otitis media only in one ear, or bleeding from your nose, or both.  You notice a lump on your neck.  You are not getting better in 3-5 days.  You feel worse instead of better. GET HELP RIGHT AWAY IF:   You have pain that is not helped with medicine.  You have puffiness, redness, or pain around your ear.  You get a stiff neck.  You cannot move part of your face (paralysis).  You notice that the bone behind your ear hurts when you touch it. MAKE SURE YOU:   Understand these instructions.  Will watch your condition.  Will get help right away if you are not doing well or get worse.   This information is not intended to replace advice given to you by your health care provider. Make sure you discuss any questions you have with your health care provider.   Document Released: 01/15/2008  Document Revised: 08/19/2014 Document Reviewed: 02/23/2013 Elsevier Interactive Patient Education 2016 [employer].  Sinusitis, Adult Sinusitis is redness, soreness, and puffiness (inflammation) of the air pockets in the bones of your face (sinuses). The redness, soreness, and puffiness can cause air and mucus to get trapped in your sinuses. This can allow germs to grow and cause an infection.  HOME CARE   Drink enough fluids to keep your pee (urine) clear or pale  yellow.  Use a humidifier in your home.  Run a hot shower to create steam in the bathroom. Sit in the bathroom with the door closed. Breathe in the steam 3-4 times a day.  Put a warm, moist washcloth on your face 3-4 times a day, or as told by your doctor.  Use salt water sprays (saline sprays) to wet the thick fluid in your nose. This can help the sinuses drain.  Only take medicine as told by your doctor. GET HELP RIGHT AWAY IF:   Your pain gets worse.  You have very bad headaches.  You are sick to your stomach (nauseous).  You throw up (vomit).  You are very sleepy (drowsy) all the time.  Your face is puffy (swollen).  Your vision changes.  You have a stiff neck.  You have trouble breathing. MAKE SURE YOU:   Understand these instructions.  Will watch your condition.  Will get help right away if you are not doing well or get worse.   This information is not intended to replace advice given to you by your health care provider. Make sure you discuss any questions you have with your health care provider.   Document Released: 01/15/2008 Document Revised: 08/19/2014 Document Reviewed: 03/03/2012 Elsevier Interactive Patient Education [employer].

## 2015-09-20 NOTE — ED Provider Notes (Signed)
CSN: [id number]     Arrival date & time 09/20/15  [id number] History   First MD Initiated Contact with Patient 09/20/15 1100    Nurses notes were reviewed. Chief Complaint  Patient presents with  . Cough  . Nasal Congestion   Presents with nasal congestion coughing. States started last week Thursday. She seen by PCP office on Monday and one of the Associates place on albuterol inhaler because of bronchospasms. She does not have a history of asthma or wheezing before. States several family members have been sick but clears seems to be worse and seems to be progressing. She also reports that he last night her ears popped and she states she passed out when asked further description without and she was sitting on the couch and screamed out for her husband who came in and help revive her. She states she was out for a few seconds. Never had that happen to her before either. She states she's coughing up yellowish-green sputum blowing her nose she is feels pressure treatment in her sinuses and she is also having wheezing as well.  She is a diabetic, has hyperlipidemia and hypertension. She denies any significant family medical history and she does not smoke and never has smoked.   (Consider location/radiation/quality/duration/timing/severity/associated sxs/prior Treatment) Patient is a 65 y.o. female presenting with cough. The history is provided by the patient. No language interpreter was used.  Cough Cough characteristics:  Productive, barking and paroxysmal Sputum characteristics:  Nondescript Severity:  Moderate Timing:  Constant Chronicity:  New Relieved by:  Nothing Ineffective treatments:  None tried Associated symptoms: ear pain, fever and shortness of breath     Past Medical History  Diagnosis Date  . Diabetes mellitus without complication ([place])   . Hypertension   . Anxiety   . Headache     migraines  . Seasonal allergies   . History of hiatal hernia   . Arthritis   . Depression   .  Hypercholesteremia   . Anginal pain ([place])   . Sleep apnea     Report - Care everywhere - 06/09/2014   Past Surgical History  Procedure Laterality Date  . Carpal tunnel release    . Cesarean section    . Hernia repair    . Knee arthroscopy    . Cataract extraction w/phaco Left 02/08/2015    Procedure: CATARACT EXTRACTION PHACO AND INTRAOCULAR LENS PLACEMENT (IOC);  Surgeon: Leandrew Koyanagi, MD;  Location: [place];  Service: Ophthalmology;  Laterality: Left;  DIABETIC   History reviewed. No pertinent family history. Social History  Substance Use Topics  . Smoking status: Never Smoker   . Smokeless tobacco: None  . Alcohol Use: No   OB History    No data available     Review of Systems  Constitutional: Positive for fever and fatigue.  HENT: Positive for congestion, ear pain and facial swelling.   Respiratory: Positive for cough and shortness of breath.   All other systems reviewed and are negative.   Allergies  Ciprofloxacin and Tylenol sinus severe  Home Medications   Prior to Admission medications   Medication Sig Start Date End Date Taking? Authorizing Provider  albuterol (PROVENTIL HFA;VENTOLIN HFA) 108 (90 BASE) MCG/ACT inhaler Inhale 2 puffs into the lungs every 6 (six) hours as needed for wheezing or shortness of breath.    Historical Provider, MD  aspirin EC 81 MG tablet Take 81 mg by mouth daily.    Historical Provider, MD  carvedilol (COREG) 6.25 MG  tablet Take 6.25 mg by mouth 2 (two) times daily with a meal.    Historical Provider, MD  cefUROXime (CEFTIN) 500 MG tablet Take 1 tablet (500 mg total) by mouth 2 (two) times daily. 09/20/15   Frederich Cha, MD  chlorpheniramine-HYDROcodone [facility] PENNKINETIC ER) 10-8 MG/5ML SUER Take 5 mLs by mouth every 12 (twelve) hours as needed for cough. 09/20/15   Frederich Cha, MD  Cholecalciferol 2000 UNITS CAPS Take 2,000 Units by mouth daily.     Historical Provider, MD  clonazePAM (KLONOPIN) 0.5 MG tablet Take  0.5 mg by mouth 2 (two) times daily.    Historical Provider, MD  cyclobenzaprine (FLEXERIL) 10 MG tablet Take 10 mg by mouth at bedtime.     Historical Provider, MD  GARLIC PO Take 1 tablet by mouth at bedtime.     Historical Provider, MD  glimepiride (AMARYL) 2 MG tablet Take 2 mg by mouth daily.    Historical Provider, MD  hydrochlorothiazide (HYDRODIURIL) 12.5 MG tablet Take 12.5 mg by mouth daily.    Historical Provider, MD  HYDROcodone-acetaminophen (NORCO/VICODIN) 5-325 MG per tablet Take 1 tablet by mouth every 6 (six) hours as needed for moderate pain.    Historical Provider, MD  insulin aspart (NOVOLOG) 100 UNIT/ML injection Inject 30 Units into the skin daily with supper.     Historical Provider, MD  insulin glargine (LANTUS) 100 UNIT/ML injection Inject 70 Units into the skin at bedtime.    Historical Provider, MD  lisinopril (PRINIVIL,ZESTRIL) 40 MG tablet Take 20 mg by mouth daily.    Historical Provider, MD  magnesium oxide (MAG-OX) 400 MG tablet Take 400 mg by mouth 2 (two) times daily.    Historical Provider, MD  metFORMIN (GLUCOPHAGE) 1000 MG tablet Take 1,000 mg by mouth See admin instructions. 500 mg with breakfast, and 2000 mg with dinner    Historical Provider, MD  Multiple Vitamin (MULTIVITAMIN) tablet Take 1 tablet by mouth daily.    Historical Provider, MD  omega-3 acid ethyl esters (LOVAZA) 1 G capsule Take 1 g by mouth at bedtime.    Historical Provider, MD  oxybutynin (DITROPAN-XL) 5 MG 24 hr tablet Take 5 mg by mouth at bedtime.    Historical Provider, MD  predniSONE (STERAPRED UNI-PAK 21 TAB) 10 MG (21) TBPK tablet Sig 6 tablet day 1, 5 tablets day 2, 4 tablets day 3,,3tablets day 4, 2 tablets day 5, 1 tablet day 6 take all tablets orally 09/20/15   Frederich Cha, MD  sertraline (ZOLOFT) 100 MG tablet Take 100 mg by mouth 2 (two) times daily.    Historical Provider, MD  simvastatin (ZOCOR) 40 MG tablet Take 40 mg by mouth at bedtime.     Historical Provider, MD  zolpidem  (AMBIEN) 5 MG tablet Take 5 mg by mouth at bedtime as needed for sleep.    Historical Provider, MD   Meds Ordered and Administered this Visit   Medications  ipratropium-albuterol (DUONEB) 0.5-2.5 (3) MG/3ML nebulizer solution 3 mL (3 mLs Nebulization Given 09/20/15 1121)    BP 142/59 mmHg  Pulse 63  Temp(Src) 98 F (36.7 C) (Tympanic)  Resp 17  Ht 4\' 10"  (1.473 m)  Wt 205 lb (92.987 kg)  BMI 42.86 kg/m2  SpO2 95% No data found.   Physical Exam  Constitutional: She is oriented to person, place, and time. Vital signs are normal. She appears well-nourished. She is active. She has a sickly appearance. She appears ill.  Obese white female  HENT:  Head:  Normocephalic.  Right Ear: Hearing, external ear and ear canal normal. Tympanic membrane is erythematous and bulging.  Left Ear: Hearing, external ear and ear canal normal. Tympanic membrane is erythematous.  Nose: Mucosal edema and rhinorrhea present. Right sinus exhibits maxillary sinus tenderness. Left sinus exhibits maxillary sinus tenderness.  Mouth/Throat: Uvula is midline. Posterior oropharyngeal erythema present.  Eyes: Conjunctivae are normal. Pupils are equal, round, and reactive to light.  Neck: Normal range of motion.  Cardiovascular: Normal rate, regular rhythm and normal heart sounds.   Pulmonary/Chest: Effort normal. No respiratory distress. She has no decreased breath sounds. She has wheezes.  Patient actively coughing`  Musculoskeletal: Normal range of motion. She exhibits no edema.  Neurological: She is alert and oriented to person, place, and time. No cranial nerve deficit.  Skin: Skin is warm and dry.  Psychiatric: She has a normal mood and affect.  Vitals reviewed.   ED Course  Procedures (including critical care time)  Labs Review Labs Reviewed - No data to display  Imaging Review No results found.   Visual Acuity Review  Right Eye Distance:   Left Eye Distance:   Bilateral Distance:    Right Eye  Near:   Left Eye Near:    Bilateral Near:         MDM   1. Bronchitis, acute, with bronchospasm   2. Acute maxillary sinusitis, recurrence not specified   3. Acute suppurative otitis media of both ears without spontaneous rupture of tympanic membranes, recurrence not specified    Patient will be given DuoNeb aerosol treatment. On discharge will give a prescription for Tussionex 1 teaspoon twice a day, Ceftin 500 one tablet twice a day and also will give her 6 day steroid dose pack.. Will have her  follow up iwith PCP in one week if not better. Patient declined work note Note: This dictation was prepared with Sales executive along with smaller Company secretary. Any transcriptional errors that result from this process are unintentional.     Frederich Cha, MD 09/20/15 1131

## 2015-09-20 NOTE — ED Notes (Signed)
Patient c/o cough and chest cognestion, nasal congestion, and HAs for over a week.

## 2015-10-17 ENCOUNTER — Ambulatory Visit (INDEPENDENT_AMBULATORY_CARE_PROVIDER_SITE_OTHER): Payer: BLUE CROSS/BLUE SHIELD

## 2015-10-17 ENCOUNTER — Ambulatory Visit
Admission: EM | Admit: 2015-10-17 | Discharge: 2015-10-17 | Disposition: A | Discharge status: Home / Self Care | Payer: BLUE CROSS/BLUE SHIELD | Attending: Family Medicine | Admitting: Family Medicine

## 2015-10-17 DIAGNOSIS — J111 Influenza due to unidentified influenza virus with other respiratory manifestations: Secondary | ICD-10-CM | POA: Diagnosis not present

## 2015-10-17 LAB — RAPID INFLUENZA A&B ANTIGENS (ARMC ONLY)
INFLUENZA A (ARMC): NEGATIVE
INFLUENZA B (ARMC): NEGATIVE

## 2015-10-17 MED ORDER — HYDROCOD POLST-CPM POLST ER 10-8 MG/5ML PO SUER: 5.0000 mL | Freq: Two times a day (BID) | ORAL | Status: DC

## 2015-10-17 MED ORDER — ONDANSETRON 4 MG PO TBDP: 4.0000 mg | ORAL_TABLET | Freq: Three times a day (TID) | ORAL | Status: DC | PRN

## 2015-10-17 MED ORDER — OSELTAMIVIR PHOSPHATE 75 MG PO CAPS: 75.0000 mg | ORAL_CAPSULE | Freq: Two times a day (BID) | ORAL | Status: DC

## 2015-10-17 NOTE — Discharge Instructions (Signed)
Influenza, Adult °Influenza ("the flu") is a viral infection of the respiratory tract. It occurs more often in winter months because people spend more time in close contact with one another. Influenza can make you feel very sick. Influenza easily spreads from person to person (contagious). °CAUSES  °Influenza is caused by a virus that infects the respiratory tract. You can catch the virus by breathing in droplets from an infected person's cough or sneeze. You can also catch the virus by touching something that was recently contaminated with the virus and then touching your mouth, nose, or eyes. °RISKS AND COMPLICATIONS °You may be at risk for a more severe case of influenza if you smoke cigarettes, have diabetes, have chronic heart disease (such as heart failure) or lung disease (such as asthma), or if you have a weakened immune system. Elderly people and pregnant women are also at risk for more serious infections. The most common problem of influenza is a lung infection (pneumonia). Sometimes, this problem can require emergency medical care and may be life threatening. °SIGNS AND SYMPTOMS  °Symptoms typically last 4 to 10 days and may include: °· Fever. °· Chills. °· Headache, body aches, and muscle aches. °· Sore throat. °· Chest discomfort and cough. °· Poor appetite. °· Weakness or feeling tired. °· Dizziness. °· Nausea or vomiting. °DIAGNOSIS  °Diagnosis of influenza is often made based on your history and a physical exam. A nose or throat swab test can be done to confirm the diagnosis. °TREATMENT  °In mild cases, influenza goes away on its own. Treatment is directed at relieving symptoms. For more severe cases, your health care provider may prescribe antiviral medicines to shorten the sickness. Antibiotic medicines are not effective because the infection is caused by a virus, not by bacteria. °HOME CARE INSTRUCTIONS °· Take medicines only as directed by your health care provider. °· Use a cool mist humidifier  to make breathing easier. °· Get plenty of rest until your temperature returns to normal. This usually takes 3 to 4 days. °· Drink enough fluid to keep your urine clear or pale yellow. °· Cover your mouth and nose when coughing or sneezing, and wash your hands well to prevent the virus from spreading. °· Stay home from work or school until the fever is gone for at least 1 full day. °PREVENTION  °An annual influenza vaccination (flu shot) is the best way to avoid getting influenza. An annual flu shot is now routinely recommended for all adults in the [place] °SEEK MEDICAL CARE IF: °· You experience chest pain, your cough worsens, or you produce more mucus. °· You have nausea, vomiting, or diarrhea. °· Your fever returns or gets worse. °SEEK IMMEDIATE MEDICAL CARE IF: °· You have trouble breathing, you become short of breath, or your skin or nails become bluish. °· You have severe pain or stiffness in the neck. °· You develop a sudden headache, or pain in the face or ear. °· You have nausea or vomiting that you cannot control. °MAKE SURE YOU:  °· Understand these instructions. °· Will watch your condition. °· Will get help right away if you are not doing well or get worse. °  °This information is not intended to replace advice given to you by your health care provider. Make sure you discuss any questions you have with your health care provider. °  °Document Released: 07/26/2000 Document Revised: 08/19/2014 Document Reviewed: 10/28/2011 °Elsevier Interactive Patient Education ©2016 [employer] ° °Cool Mist Vaporizers °Vaporizers may help relieve the symptoms of a cough and cold. They add moisture to the air, which helps mucus to become thinner and less sticky. This makes it easier to   breathe and cough up secretions. Cool mist vaporizers do not cause serious burns like hot mist vaporizers, which may also be called steamers or humidifiers. Vaporizers have not been proven to help with colds. You should not use a vaporizer  if you are allergic to mold. °HOME CARE INSTRUCTIONS °· Follow the package instructions for the vaporizer. °· Do not use anything other than distilled water in the vaporizer. °· Do not run the vaporizer all of the time. This can cause mold or bacteria to grow in the vaporizer. °· Clean the vaporizer after each time it is used. °· Clean and dry the vaporizer well before storing it. °· Stop using the vaporizer if worsening respiratory symptoms develop. °  °This information is not intended to replace advice given to you by your health care provider. Make sure you discuss any questions you have with your health care provider. °  °Document Released: 04/25/2004 Document Revised: 08/03/2013 Document Reviewed: 12/16/2012 °Elsevier Interactive Patient Education ©2016 [employer] ° °

## 2015-10-17 NOTE — Telephone Encounter (Signed)
This encounter was created in error - please disregard.

## 2015-10-17 NOTE — ED Provider Notes (Signed)
CSN: [id number]     Arrival date & time 10/17/15  1608 History   First MD Initiated Contact with Patient 10/17/15 2016     Chief Complaint  Patient presents with  . Influenza   (Consider location/radiation/quality/duration/timing/severity/associated sxs/prior Treatment) HPI  65 year old female who presents onset of vomiting at 9 AM this morning after she smelled the hair product that her granddaughter was appliing to her hair. Has had general body aches headache left ear pain chills and productive cough. SHe has been febrile today but didn't actually take her Temperature since she was unable to find her thermometer and has a fever of 99 6 when seen today. She states that everything hurts  Past Medical History  Diagnosis Date  . Diabetes mellitus without complication ([place])   . Hypertension   . Anxiety   . Headache     migraines  . Seasonal allergies   . History of hiatal hernia   . Arthritis   . Depression   . Hypercholesteremia   . Anginal pain ([place])   . Sleep apnea     Report - Care everywhere - 06/09/2014   Past Surgical History  Procedure Laterality Date  . Carpal tunnel release    . Cesarean section    . Hernia repair    . Knee arthroscopy    . Cataract extraction w/phaco Left 02/08/2015    Procedure: CATARACT EXTRACTION PHACO AND INTRAOCULAR LENS PLACEMENT (IOC);  Surgeon: Leandrew Koyanagi, MD;  Location: [facility];  Service: Ophthalmology;  Laterality: Left;  DIABETIC   Family History  Problem Relation Age of Onset  . Cancer Mother   . Aneurysm Father    Social History  Substance Use Topics  . Smoking status: Never Smoker   . Smokeless tobacco: None  . Alcohol Use: No   OB History    No data available     Review of Systems  Constitutional: Positive for fever, chills and fatigue. Negative for activity change.  HENT: Positive for congestion, ear pain, postnasal drip, rhinorrhea, sinus pressure and sneezing.   Respiratory: Positive for cough.  Negative for wheezing and stridor.   Gastrointestinal: Positive for nausea and vomiting. Negative for abdominal pain and diarrhea.  All other systems reviewed and are negative.   Allergies  Ciprofloxacin and Tylenol sinus severe  Home Medications   Prior to Admission medications   Medication Sig Start Date End Date Taking? Authorizing Provider  albuterol (PROVENTIL HFA;VENTOLIN HFA) 108 (90 BASE) MCG/ACT inhaler Inhale 2 puffs into the lungs every 6 (six) hours as needed for wheezing or shortness of breath.   Yes Historical Provider, MD  aspirin EC 81 MG tablet Take 81 mg by mouth daily.   Yes Historical Provider, MD  carvedilol (COREG) 6.25 MG tablet Take 6.25 mg by mouth 2 (two) times daily with a meal.   Yes Historical Provider, MD  clonazePAM (KLONOPIN) 0.5 MG tablet Take 0.5 mg by mouth 2 (two) times daily.   Yes Historical Provider, MD  glimepiride (AMARYL) 2 MG tablet Take 2 mg by mouth daily.   Yes Historical Provider, MD  hydrochlorothiazide (HYDRODIURIL) 12.5 MG tablet Take 12.5 mg by mouth daily.   Yes Historical Provider, MD  insulin aspart (NOVOLOG) 100 UNIT/ML injection Inject 30 Units into the skin daily with supper.    Yes Historical Provider, MD  insulin glargine (LANTUS) 100 UNIT/ML injection Inject 70 Units into the skin at bedtime.   Yes Historical Provider, MD  lisinopril (PRINIVIL,ZESTRIL) 40 MG tablet Take 20  mg by mouth daily.   Yes Historical Provider, MD  magnesium oxide (MAG-OX) 400 MG tablet Take 400 mg by mouth 2 (two) times daily.   Yes Historical Provider, MD  metFORMIN (GLUCOPHAGE) 1000 MG tablet Take 1,000 mg by mouth See admin instructions. 500 mg with breakfast, and 2000 mg with dinner   Yes Historical Provider, MD  omega-3 acid ethyl esters (LOVAZA) 1 G capsule Take 1 g by mouth at bedtime.   Yes Historical Provider, MD  oxybutynin (DITROPAN-XL) 5 MG 24 hr tablet Take 5 mg by mouth at bedtime.   Yes Historical Provider, MD  predniSONE (STERAPRED UNI-PAK 21  TAB) 10 MG (21) TBPK tablet Sig 6 tablet day 1, 5 tablets day 2, 4 tablets day 3,,3tablets day 4, 2 tablets day 5, 1 tablet day 6 take all tablets orally 09/20/15  Yes Frederich Cha, MD  sertraline (ZOLOFT) 100 MG tablet Take 100 mg by mouth 2 (two) times daily.   Yes Historical Provider, MD  simvastatin (ZOCOR) 40 MG tablet Take 40 mg by mouth at bedtime.    Yes Historical Provider, MD  zolpidem (AMBIEN) 5 MG tablet Take 5 mg by mouth at bedtime as needed for sleep.   Yes Historical Provider, MD  cefUROXime (CEFTIN) 500 MG tablet Take 1 tablet (500 mg total) by mouth 2 (two) times daily. 09/20/15   Frederich Cha, MD  chlorpheniramine-HYDROcodone [place] PENNKINETIC ER) 10-8 MG/5ML SUER Take 5 mLs by mouth 2 (two) times daily. 10/17/15   Lorin Picket, PA-C  Cholecalciferol 2000 UNITS CAPS Take 2,000 Units by mouth daily.     Historical Provider, MD  cyclobenzaprine (FLEXERIL) 10 MG tablet Take 10 mg by mouth at bedtime.     Historical Provider, MD  GARLIC PO Take 1 tablet by mouth at bedtime.     Historical Provider, MD  HYDROcodone-acetaminophen (NORCO/VICODIN) 5-325 MG per tablet Take 1 tablet by mouth every 6 (six) hours as needed for moderate pain.    Historical Provider, MD  Multiple Vitamin (MULTIVITAMIN) tablet Take 1 tablet by mouth daily.    Historical Provider, MD  ondansetron (ZOFRAN ODT) 4 MG disintegrating tablet Take 1 tablet (4 mg total) by mouth every 8 (eight) hours as needed for nausea or vomiting. 10/17/15   Lorin Picket, PA-C  oseltamivir (TAMIFLU) 75 MG capsule Take 1 capsule (75 mg total) by mouth every 12 (twelve) hours. 10/17/15   Lorin Picket, PA-C   Meds Ordered and Administered this Visit  Medications - No data to display  BP 136/66 mmHg  Pulse 78  Temp(Src) 99.6 F (37.6 C) (Tympanic)  Resp 20  Ht 4\' 10"  (1.473 m)  Wt 205 lb (92.987 kg)  BMI 42.86 kg/m2  SpO2 95% No data found.   Physical Exam  Constitutional: She is oriented to person, place, and time. She  appears well-developed and well-nourished. No distress.  HENT:  Head: Normocephalic and atraumatic.  Right Ear: External ear normal.  Left Ear: External ear normal.  Nose: Nose normal.  Mouth/Throat: No oropharyngeal exudate.  Eyes: Conjunctivae are normal. Pupils are equal, round, and reactive to light.  Neck: Normal range of motion. Neck supple.  Pulmonary/Chest: Effort normal and breath sounds normal. No respiratory distress. She has no wheezes. She has no rales.  Musculoskeletal: Normal range of motion. She exhibits no edema or tenderness.  Lymphadenopathy:    She has no cervical adenopathy.  Neurological: She is alert and oriented to person, place, and time.  Skin: Skin is  warm and dry. She is not diaphoretic.  Psychiatric: She has a normal mood and affect. Her behavior is normal. Judgment and thought content normal.  Nursing note and vitals reviewed.   ED Course  Procedures (including critical care time)  Labs Review Labs Reviewed  RAPID INFLUENZA A&B ANTIGENS ([place])    Imaging Review Dg Chest 2 View  10/17/2015  CLINICAL DATA:  Cough and wheezing.  Vomiting, body aches today. EXAM: CHEST  2 VIEW COMPARISON:  07/14/2015 FINDINGS: The cardiomediastinal contours are normal, mild atherosclerosis of the thoracic aorta. The lungs are clear. Pulmonary vasculature is normal. No consolidation, pleural effusion, or pneumothorax. No acute osseous abnormalities are seen. There is degenerative change in the spine. IMPRESSION: No acute process. Electronically Signed   By: Jeb Levering M.D.   On: 10/17/2015 20:06     Visual Acuity Review  Right Eye Distance:   Left Eye Distance:   Bilateral Distance:    Right Eye Near:   Left Eye Near:    Bilateral Near:         MDM   1. Influenza    New Prescriptions   CHLORPHENIRAMINE-HYDROCODONE (T[place]IONEX PENNKINETIC ER) 10-8 MG/5ML SUER    Take 5 mLs by mouth 2 (two) times daily.   ONDANSETRON (ZOFRAN ODT) 4 MG DISINTEGRATING  TABLET    Take 1 tablet (4 mg total) by mouth every 8 (eight) hours as needed for nausea or vomiting.   OSELTAMIVIR (TAMIFLU) 75 MG CAPSULE    Take 1 capsule (75 mg total) by mouth every 12 (twelve) hours.  Plan: 1. Test/x-ray results and diagnosis reviewed with patient 2. rx as per orders; risks, benefits, potential side effects reviewed with patient 3. Recommend supportive treatment with rest and fluids. Her influenza was negative today in the office the patient appears to have the flu and complains of body aches so I will start her on Tamiflu. Given her prescription for a cough medicine nighttime and also for her nausea. SHe is not improving or is worsening she needs go to the emergency room or see her primary care physician at [facility] 4. F/u prn if symptoms worsen or don't improve     Lorin Picket, PA-C 10/17/15 2039  Lorin Picket, [place] 10/17/15 2041

## 2015-10-17 NOTE — ED Notes (Signed)
Sudden onset of vomiting at 9am and has vomited 6 times since. C/o general bodyaches, headache, left ear pain and chills

## 2015-11-01 ENCOUNTER — Ambulatory Visit
Admission: RE | Admit: 2015-11-01 | Discharge: 2015-11-01 | Disposition: A | Discharge status: Home / Self Care | Payer: Disability Insurance | Source: Ambulatory Visit | Attending: Thoracic Surgery | Admitting: Thoracic Surgery

## 2015-11-01 ENCOUNTER — Other Ambulatory Visit: Payer: Self-pay | Admitting: Thoracic Surgery

## 2015-11-01 DIAGNOSIS — M7732 Calcaneal spur, left foot: Secondary | ICD-10-CM | POA: Insufficient documentation

## 2015-11-01 DIAGNOSIS — E785 Hyperlipidemia, unspecified: Secondary | ICD-10-CM

## 2016-04-08 ENCOUNTER — Ambulatory Visit: Payer: BLUE CROSS/BLUE SHIELD | Attending: Family Medicine | Admitting: Physical Therapy

## 2016-04-08 ENCOUNTER — Encounter: Payer: Self-pay | Admitting: Physical Therapy

## 2016-04-08 DIAGNOSIS — M25512 Pain in left shoulder: Secondary | ICD-10-CM | POA: Diagnosis present

## 2016-04-08 DIAGNOSIS — G542 Cervical root disorders, not elsewhere classified: Secondary | ICD-10-CM | POA: Diagnosis present

## 2016-04-08 DIAGNOSIS — R293 Abnormal posture: Secondary | ICD-10-CM | POA: Diagnosis not present

## 2016-04-08 DIAGNOSIS — M542 Cervicalgia: Secondary | ICD-10-CM

## 2016-04-08 DIAGNOSIS — M25511 Pain in right shoulder: Secondary | ICD-10-CM

## 2016-04-08 NOTE — Therapy (Signed)
[facility] [address] [place], [place], [address] Phone: [phone]   Fax:  [phone]  Physical Therapy Evaluation  Patient Details  Name: Diana Roberts MRN: [id number] Date of Birth: Apr 14, 1951 Referring Provider: Ballard Russell, MD  Encounter Date: 04/08/2016      PT End of Session - 04/08/16 1851    Visit Number 1   Number of Visits 8   Date for PT Re-Evaluation 05/06/16   PT Start Time 1350   PT Stop Time 1440   PT Time Calculation (min) 50 min   Activity Tolerance Patient tolerated treatment well;Patient limited by pain   Behavior During Therapy [facility] for tasks assessed/performed      Past Medical History:  Diagnosis Date  . Anginal pain ([place])   . Anxiety   . Arthritis   . Depression   . Diabetes mellitus without complication ([place])   . Headache    migraines  . History of hiatal hernia   . Hypercholesteremia   . Hypertension   . Seasonal allergies   . Sleep apnea    Report - Care everywhere - 06/09/2014    Past Surgical History:  Procedure Laterality Date  . CARPAL TUNNEL RELEASE    . CATARACT EXTRACTION W/PHACO Left 02/08/2015   Procedure: CATARACT EXTRACTION PHACO AND INTRAOCULAR LENS PLACEMENT (IOC);  Surgeon: Leandrew Koyanagi, MD;  Location: [facility];  Service: Ophthalmology;  Laterality: Left;  DIABETIC  . CESAREAN SECTION    . HERNIA REPAIR    . KNEE ARTHROSCOPY      There were no vitals filed for this visit.       Subjective Assessment - 04/08/16 1843    Subjective Pt fall approximately a month ago; states that she fell on her tail bone and hit the back of her head and right elbow. Ever since then she has experienced bilateral shoulder/arm/hand and neck pain with numbness and tingling noted in bilateral UE.  She states she is bothered by activities using her arms and reports episodes of weakness in the left hand. She does not experience pain with neck movements that she  has noticed but reports neck pain primarily in region of upper trapezius bilaterally.    Patient is accompained by: Family member   Pertinent History Pt fell approximately 1 month ago and has experienced bilateral shoulder pain and N/T since then.   Limitations Lifting;House hold activities   Diagnostic tests No imaging   Patient Stated Goals Pt would like to have a reduction in neck/arm/hand pain and increase strength in L hand/shoulder so that she can resume normal activities   Currently in Pain? Yes   Pain Score 5    Pain Location Shoulder   Pain Orientation Left   Pain Type Chronic pain   Pain Radiating Towards Upper trapezius to anterior shoulder across upper arm down through forearm and into thumb and index finger.   Pain Onset More than a month ago      Objective:  Therapeutic Exercise: Reviewed/demonstrated HEP: Supine cervical retraction x10. Seated thoracic ext x10. R/L Upper trapezius stretch 45 sec x2. Radial nerve glide with LF x3.   Pt response for medical necessity: Pt presents with sx cluster remarkable of cervical radiculopathy. She demonstrates limitations in UE use secondary to pain, onset of N/T and episodes of weakness. She tolerates therapeutic exercise program with mild c/o of neck pain in cervical spine.       PT Education - 04/08/16 1850  Education provided Yes   Education Details see HEP/patient instructions: cervical retraction, radial nerve glide, thoracic extension and upper trapezius stretch   Person(s) Educated Patient   Methods Explanation;Demonstration;Handout   Comprehension Verbalized understanding;Returned demonstration;Need further instruction             PT Long Term Goals - 04/08/16 1902      PT LONG TERM GOAL #1   Title Pt will score <30% on QuickDash to promote increase in functional mobility   Baseline 8/28: QuickDash: 59.1   Time 4   Period Weeks   Status New     PT LONG TERM GOAL #2   Title Pt will achieve functional ROM  for IR/ER of L UE with <2/10 pain so that she can wash her back   Baseline 8/28: pt unable to wash her back at this time secondary to onset of shoulder pain   Time 4   Period Weeks   Status New     PT LONG TERM GOAL #3   Title Pt will perform HEP program to promote independent exercise and self management of UE/neck pain.   Baseline pt currently limited in activity tolerance/currently not exercising   Time 4   Period Weeks   Status New     PT LONG TERM GOAL #4   Title Pt will demonstrate functional posture and complete UE reaching/lifting tasks with <2/10 pain in shoulder/neck   Baseline pt unable to perform daily tasks such as opening jars, washing walls, etc. secondary to onset of pain   Time 4   Period Weeks   Status New               Plan - 04/08/16 1852    Clinical Impression Statement Pt is a pleasant 65 year old female with onset of BUE and neck pain following a fall approximately a month ago. Pain: she experiences 0/10 at best with average 5/10 in LUE with acute exacerbation of 10/10 in LUE with specific reaching tasks. She experiences bilateral N/T with most notable sx in LUE through thumb and index finger. Pt presents with gross functional ROM in bilateral UE. Cervical ROM: flex/ext/LF within functional limits, R/L Rot: 71/80 deg respectively. Strength: MMT BUE shoulder flexion 4/4 (pain limited), abd 4+/4+, biceps 4+/4+ (pain limited) triceps 4-/4- (pain limited), ER/IR 4-/4- bilaterally (pain limited). Palpation: PT with tenderness to palpation in finger/wrist extensors, and L upper trapezius. Passive Accessory: Pt with tenderness to palpation with CPA C3-C6 (moderate hypomobility) and with referral of N/T with CPA C7-T8 (moderate hypomobility). Grip: R 37.4 L 24.0 Special Tests: Michel Bickers (-), Empty Can (-), ULTT radial nerve (+). Outcome Measures: QuickDash 59.1%    Rehab Potential Good   PT Frequency 2x / week   PT Duration 4 weeks   PT Treatment/Interventions  ADLs/Self Care Home Management;Electrical Stimulation;Cryotherapy;Iontophoresis 4mg /ml Dexamethasone;Moist Heat;Functional mobility training;Therapeutic exercise;Therapeutic activities;Patient/family education;Manual techniques;Passive range of motion   PT Next Visit Plan spurlings/tx. review radial nerve glide. STM upper trapezius   PT Home Exercise Plan see patient instructions   Consulted and Agree with Plan of Care Patient;Family member/caregiver      Patient will benefit from skilled therapeutic intervention in order to improve the following deficits and impairments:  Decreased activity tolerance, Decreased mobility, Decreased range of motion, Decreased strength, Hypomobility, Increased muscle spasms, Impaired sensation, Impaired UE functional use, Improper body mechanics, Postural dysfunction, Obesity, Pain  Visit Diagnosis: Abnormal posture  Cervical root disorders, not elsewhere classified  Cervicalgia  Pain in left shoulder  Pain in right shoulder     Problem List There are no active problems to display for this patient.  Pura Spice, PT, DPT # [id number] Derrill Memo, SPT 04/09/2016, 12:51 PM  [facility] [address] [place], [place], [address] Phone: [phone]   Fax:  [phone]  Name: Diana Roberts MRN: [id number] Date of Birth: [birth year]

## 2016-04-10 ENCOUNTER — Encounter: Payer: Self-pay | Admitting: Physical Therapy

## 2016-04-10 ENCOUNTER — Ambulatory Visit: Payer: BLUE CROSS/BLUE SHIELD | Admitting: Physical Therapy

## 2016-04-10 DIAGNOSIS — M542 Cervicalgia: Secondary | ICD-10-CM

## 2016-04-10 DIAGNOSIS — R293 Abnormal posture: Secondary | ICD-10-CM

## 2016-04-10 DIAGNOSIS — M25511 Pain in right shoulder: Secondary | ICD-10-CM

## 2016-04-10 DIAGNOSIS — M25512 Pain in left shoulder: Secondary | ICD-10-CM

## 2016-04-10 DIAGNOSIS — G542 Cervical root disorders, not elsewhere classified: Secondary | ICD-10-CM

## 2016-04-10 NOTE — Therapy (Signed)
[facility] [address] [place], [place], [address] Phone: [phone]   Fax:  [phone]  Physical Therapy Treatment  Patient Details  Name: Diana Roberts MRN: [id number] Date of Birth: 05/10/1951 Referring Provider: Ballard Russell, MD  Encounter Date: 04/10/2016      PT End of Session - 04/10/16 1809    Visit Number 2   Number of Visits 8   Date for PT Re-Evaluation 05/06/16   PT Start Time [id number]   PT Stop Time 1131   PT Time Calculation (min) 44 min   Activity Tolerance Patient tolerated treatment well;Patient limited by pain   Behavior During Therapy [facility] for tasks assessed/performed      Past Medical History:  Diagnosis Date  . Anginal pain ([place])   . Anxiety   . Arthritis   . Depression   . Diabetes mellitus without complication ([place])   . Headache    migraines  . History of hiatal hernia   . Hypercholesteremia   . Hypertension   . Seasonal allergies   . Sleep apnea    Report - Care everywhere - 06/09/2014    Past Surgical History:  Procedure Laterality Date  . CARPAL TUNNEL RELEASE    . CATARACT EXTRACTION W/PHACO Left 02/08/2015   Procedure: CATARACT EXTRACTION PHACO AND INTRAOCULAR LENS PLACEMENT (IOC);  Surgeon: Leandrew Koyanagi, MD;  Location: [place];  Service: Ophthalmology;  Laterality: Left;  DIABETIC  . CESAREAN SECTION    . HERNIA REPAIR    . KNEE ARTHROSCOPY      There were no vitals filed for this visit.      Subjective Assessment - 04/10/16 1806    Subjective Pt arrives to PT session with mild bilateral shoulder pain at joint line. She states that she experienced mild pain in her R upper back with performance of radial nerve glide. Pt reports numbness and tingling of volar surface of L thumb with wrist flexion/extension.   Pertinent History Pt fell approximately 1 month ago and has experienced bilateral shoulder pain and N/T since then.   Limitations Lifting;House  hold activities   Diagnostic tests No imaging   Patient Stated Goals Pt would like to have a reduction in neck/arm/hand pain and increase strength in L hand/shoulder so that she can resume normal activities   Currently in Pain? Yes   Pain Score 3    Pain Location Shoulder   Pain Orientation Left   Pain Descriptors / Indicators Aching   Pain Type Chronic pain   Pain Onset More than a month ago      Objective:  Therapeutic Exercise: Shoulder AROM in standing flexion/abduction x5. Supine shoulder AAROM with wand flexion x20, abduction x20. Cervical rotation R/L x10, flexion/extension x10 ea. Press up with wand x10.   Manual tx: PROM of L shoulder flexion/abduction. STM of anterior deltoid on L shoulder secondary to c/o pain and moderate TTP. AP glide L shoulder (grade I-II, 3 bouts, 1 minute ea.)  Pt response for medical necessity: Pt presents with reports of N/T consistent with median nerve compression in carpal tunnel. She demonstrates bilateral shoulder pain at joint line and anterior L shoulder pain radiating into deltoid. She is able to perform all therapeutic exercise but is limited to AAROM/gentle AROM at this time secondary to onset of moderate pain.        PT Education - 04/10/16 1809    Education provided Yes   Education Details see HEP: AROM cervical spine  and UE   Person(s) Educated Patient   Methods Explanation;Demonstration;Handout   Comprehension Verbalized understanding;Returned demonstration             PT Long Term Goals - 04/08/16 1902      PT LONG TERM GOAL #1   Title Pt will score <30% on QuickDash to promote increase in functional mobility   Baseline 8/28: QuickDash: 59.1   Time 4   Period Weeks   Status New     PT LONG TERM GOAL #2   Title Pt will achieve functional ROM for IR/ER of L UE with <2/10 pain so that she can wash her back   Baseline 8/28: pt unable to wash her back at this time secondary to onset of shoulder pain   Time 4   Period  Weeks   Status New     PT LONG TERM GOAL #3   Title Pt will perform HEP program to promote independent exercise and self management of UE/neck pain.   Baseline pt currently limited in activity tolerance/currently not exercising   Time 4   Period Weeks   Status New     PT LONG TERM GOAL #4   Title Pt will demonstrate functional posture and complete UE reaching/lifting tasks with <2/10 pain in shoulder/neck   Baseline pt unable to perform daily tasks such as opening jars, washing walls, etc. secondary to onset of pain   Time 4   Period Weeks   Status New               Plan - 04/10/16 1810    Clinical Impression Statement Pt is experiencing joint line pain in bilateral shoulders with marked TTP of L anterior deltoid. She demonstrates numbness and tingling in volar surface of thumb with wrist flexion/extension representative of median nerve compression/carpal tunnel syndrome. She performs shoulder AAROM exercises with fair tolerance this session; able to achieve full range shoulder flexion/abduction but mild degree of pain.    Rehab Potential Good   PT Frequency 2x / week   PT Duration 4 weeks   PT Treatment/Interventions ADLs/Self Care Home Management;Electrical Stimulation;Cryotherapy;Iontophoresis 4mg /ml Dexamethasone;Moist Heat;Functional mobility training;Therapeutic exercise;Therapeutic activities;Patient/family education;Manual techniques;Passive range of motion   PT Next Visit Plan reassess shoulder mobility, progress mobility program of shoulder/cervical spine/thoracic spine   PT Home Exercise Plan see patient instructions   Consulted and Agree with Plan of Care Patient;Family member/caregiver      Patient will benefit from skilled therapeutic intervention in order to improve the following deficits and impairments:  Decreased activity tolerance, Decreased mobility, Decreased range of motion, Decreased strength, Hypomobility, Increased muscle spasms, Impaired sensation,  Impaired UE functional use, Improper body mechanics, Postural dysfunction, Obesity, Pain  Visit Diagnosis: Abnormal posture  Cervicalgia  Pain in left shoulder  Pain in right shoulder  Cervical root disorders, not elsewhere classified     Problem List There are no active problems to display for this patient.  Pura Spice, PT, DPT # [id number] Mickel Baas Jaria Conway SPT 04/10/2016, 6:16 PM  [facility] [address] [place], [place], [address] Phone: [phone]   Fax:  [phone]  Name: Donesia Whiffen MRN: [id number] Date of Birth: 03-09-1951

## 2016-04-16 ENCOUNTER — Encounter: Payer: Self-pay | Admitting: Physical Therapy

## 2016-04-16 ENCOUNTER — Ambulatory Visit: Payer: BLUE CROSS/BLUE SHIELD | Attending: Family Medicine | Admitting: Physical Therapy

## 2016-04-16 DIAGNOSIS — M25511 Pain in right shoulder: Secondary | ICD-10-CM | POA: Insufficient documentation

## 2016-04-16 DIAGNOSIS — G542 Cervical root disorders, not elsewhere classified: Secondary | ICD-10-CM | POA: Diagnosis present

## 2016-04-16 DIAGNOSIS — M25512 Pain in left shoulder: Secondary | ICD-10-CM

## 2016-04-16 DIAGNOSIS — M542 Cervicalgia: Secondary | ICD-10-CM | POA: Diagnosis not present

## 2016-04-16 DIAGNOSIS — R293 Abnormal posture: Secondary | ICD-10-CM | POA: Diagnosis present

## 2016-04-16 NOTE — Therapy (Signed)
[place] [facility] [address] [place], [place], [address] Phone: [phone]   Fax:  [phone]  Physical Therapy Treatment  Patient Details  Name: Diana Roberts MRN: [id number] Date of Birth: 03-21-1951 Referring Provider: Ballard Russell, MD  Encounter Date: 04/16/2016      PT End of Session - 04/16/16 1234    Visit Number 3   Number of Visits 8   Date for PT Re-Evaluation 05/06/16   PT Start Time 1031   PT Stop Time 1120   PT Time Calculation (min) 49 min   Activity Tolerance Patient tolerated treatment well;Patient limited by pain   Behavior During Therapy [facility] for tasks assessed/performed      Past Medical History:  Diagnosis Date  . Anginal pain ([place])   . Anxiety   . Arthritis   . Depression   . Diabetes mellitus without complication ([place])   . Headache    migraines  . History of hiatal hernia   . Hypercholesteremia   . Hypertension   . Seasonal allergies   . Sleep apnea    Report - Care everywhere - 06/09/2014    Past Surgical History:  Procedure Laterality Date  . CARPAL TUNNEL RELEASE    . CATARACT EXTRACTION W/PHACO Left 02/08/2015   Procedure: CATARACT EXTRACTION PHACO AND INTRAOCULAR LENS PLACEMENT (IOC);  Surgeon: Leandrew Koyanagi, MD;  Location: [place];  Service: Ophthalmology;  Laterality: Left;  DIABETIC  . CESAREAN SECTION    . HERNIA REPAIR    . KNEE ARTHROSCOPY      There were no vitals filed for this visit.      Subjective Assessment - 04/16/16 1233    Subjective Pt reports that she has not had episodes of N/T in her L thumb or weakness in UE since she started physical therapy. Pt states that she experienced some mild neck/shoulder stiffness after waking up this morning; states she slept on her back. At time of PT session she reports no pain.   Pertinent History Pt fell approximately 1 month ago and has experienced bilateral shoulder pain and N/T since then.    Limitations Lifting;House hold activities   Diagnostic tests No imaging   Patient Stated Goals Pt would like to have a reduction in neck/arm/hand pain and increase strength in L hand/shoulder so that she can resume normal activities   Currently in Pain? No/denies      Objective:  Therapeutic Exercise: UBE f/b 2 minutes ea. (warm up/no charge). Closed chain short lever shoulder flexion against wall with towel x20. Lower trap lift offs x10 in pain free range. Supine chest press with wand 2x15 with early onset of muscle fatigue. Pulleys with flexion/abduction/IR focus.   Manual tx: AP/inf glide to L shoulder (4 bouts, 1 minute ea. Grade II). Pt with initial c/o of 3/10 pain with 0/10 pain at end or treatment and increased mobility/decreased pain in L shoulder. STM of L upper trapezius secondary to trigger point.  Pt response for medical necessity: Pt initially limited in bilat shoulder AROM at session onset secondary to pain. She demonstrates improvement in pain-limited bilateral shoulder ROM following manual tx.        PT Long Term Goals - 04/08/16 1902      PT LONG TERM GOAL #1   Title Pt will score <30% on QuickDash to promote increase in functional mobility   Baseline 8/28: QuickDash: 59.1   Time 4   Period Weeks   Status New  PT LONG TERM GOAL #2   Title Pt will achieve functional ROM for IR/ER of L UE with <2/10 pain so that she can wash her back   Baseline 8/28: pt unable to wash her back at this time secondary to onset of shoulder pain   Time 4   Period Weeks   Status New     PT LONG TERM GOAL #3   Title Pt will perform HEP program to promote independent exercise and self management of UE/neck pain.   Baseline pt currently limited in activity tolerance/currently not exercising   Time 4   Period Weeks   Status New     PT LONG TERM GOAL #4   Title Pt will demonstrate functional posture and complete UE reaching/lifting tasks with <2/10 pain in shoulder/neck   Baseline  pt unable to perform daily tasks such as opening jars, washing walls, etc. secondary to onset of pain   Time 4   Period Weeks   Status New           Plan - 04/16/16 1235    Clinical Impression Statement Pt demonstrates improved tolerance and range of BUE AROM following manual treatment and with AAROM/pulleys. She experiences most pain with active abduction at this time. Mild-moderate tenderness to palpation in anterior deltoid, L upper trapezius, and t. minor. Demonstrates early onset of fatigue with active ROM; improved tolerance with short lever closed chain ther ex.     Rehab Potential Good   PT Frequency 2x / week   PT Duration 4 weeks   PT Treatment/Interventions ADLs/Self Care Home Management;Electrical Stimulation;Cryotherapy;Iontophoresis 4mg /ml Dexamethasone;Moist Heat;Functional mobility training;Therapeutic exercise;Therapeutic activities;Patient/family education;Manual techniques;Passive range of motion   PT Next Visit Plan progress shoulder mobility/strengthening ther ex   PT Home Exercise Plan see patient instructions   Consulted and Agree with Plan of Care Patient      Patient will benefit from skilled therapeutic intervention in order to improve the following deficits and impairments:  Decreased activity tolerance, Decreased mobility, Decreased range of motion, Decreased strength, Hypomobility, Increased muscle spasms, Impaired sensation, Impaired UE functional use, Improper body mechanics, Postural dysfunction, Obesity, Pain  Visit Diagnosis: Cervicalgia  Abnormal posture  Pain in left shoulder  Pain in right shoulder  Cervical root disorders, not elsewhere classified     Problem List There are no active problems to display for this patient.  Pura Spice, PT, DPT # [phone] Mickel Baas Dailee Manalang SPT 04/16/2016, 12:40 PM  [place] [facility] [address] [place], [place], [address] Phone: [phone]   Fax:   [phone]  Name: Diana Roberts MRN: [id number] Date of Birth: Jan 12, 1951

## 2016-04-18 ENCOUNTER — Encounter: Payer: Disability Insurance | Admitting: Physical Therapy

## 2016-04-23 ENCOUNTER — Ambulatory Visit: Payer: BLUE CROSS/BLUE SHIELD | Admitting: Physical Therapy

## 2016-04-23 ENCOUNTER — Encounter: Payer: Self-pay | Admitting: Physical Therapy

## 2016-04-23 DIAGNOSIS — M25511 Pain in right shoulder: Secondary | ICD-10-CM

## 2016-04-23 DIAGNOSIS — R293 Abnormal posture: Secondary | ICD-10-CM

## 2016-04-23 DIAGNOSIS — M542 Cervicalgia: Secondary | ICD-10-CM

## 2016-04-23 DIAGNOSIS — M25512 Pain in left shoulder: Secondary | ICD-10-CM

## 2016-04-23 NOTE — Therapy (Signed)
[facility] [address]. [place], [place], [address] Phone: [phone]   Fax:  [phone]  Physical Therapy Treatment  Patient Details  Name: Diana Roberts MRN: [id number] Date of Birth: [birth year] Referring Provider: Ballard Russell, MD  Encounter Date: 04/23/2016      PT End of Session - 04/23/16 1312    Visit Number 4   Number of Visits 8   Date for PT Re-Evaluation 05/06/16   PT Start Time [id number]   PT Stop Time 1116   PT Time Calculation (min) 44 min   Activity Tolerance Patient tolerated treatment well   Behavior During Therapy [facility] for tasks assessed/performed      Past Medical History:  Diagnosis Date  . Anginal pain ([place])   . Anxiety   . Arthritis   . Depression   . Diabetes mellitus without complication ([place])   . Headache    migraines  . History of hiatal hernia   . Hypercholesteremia   . Hypertension   . Seasonal allergies   . Sleep apnea    Report - Care everywhere - 06/09/2014    Past Surgical History:  Procedure Laterality Date  . CARPAL TUNNEL RELEASE    . CATARACT EXTRACTION W/PHACO Left 02/08/2015   Procedure: CATARACT EXTRACTION PHACO AND INTRAOCULAR LENS PLACEMENT (IOC);  Surgeon: Leandrew Koyanagi, MD;  Location: [place];  Service: Ophthalmology;  Laterality: Left;  DIABETIC  . CESAREAN SECTION    . HERNIA REPAIR    . KNEE ARTHROSCOPY      There were no vitals filed for this visit.      Subjective Assessment - 04/23/16 1039    Subjective Pt reports that she has been sick over the last week/taking care of her sick husband. States that she has had minimal shoulder and neck pain since her last visit. States that she has only been able to do her HEP program once since her last visit.   Patient is accompained by: Family member   Pertinent History Pt fell approximately 1 month ago and has experienced bilateral shoulder pain and N/T since then.   Limitations Lifting;House  hold activities   Diagnostic tests No imaging   Patient Stated Goals Pt would like to have a reduction in neck/arm/hand pain and increase strength in L hand/shoulder so that she can resume normal activities   Currently in Pain? Yes   Pain Score 1    Pain Location Shoulder   Pain Orientation Left   Pain Descriptors / Indicators Aching   Pain Type Chronic pain   Pain Onset More than a month ago     Objective:   Therapeutic Exercise: UBE f/b 3 minutes ea. (warm up/no charge). Closed chain short lever shoulder flexion against wall with towel x20. Standing shoulder flexion x30 with wand. Standing internal rotation with wand x30. Standing shoulder abduction x30 with wand. Horizontal abduction with YTB x30. Scaption #1 x30. Bicep curls #2 3x10. Supine chest press #2.5 3x10. Serratus punch #2 3x10. Weighted shoulder flexion #2.5 x30. Shoulder horizontal abd/add #1 in supine 3x10.   Pt response for medical necessity: Pt arrives to PT appt with 1/10 L shoulder pain; she responds well to therapeutic exercise and leaves with no c/o of shoulder pain. She tolerates shoulder AROM with no c/o of pain and is progressing well through strengthening program for bilateral UE. She continues to report intermittent L thumb tingling but no c/o during session.       PT  Education - 04/23/16 1311    Education provided Yes   Education Details see HEP; progressive resisted/weighted shoulder AROM   Person(s) Educated Patient   Methods Explanation;Demonstration;Handout   Comprehension Verbalized understanding;Returned demonstration             PT Long Term Goals - 04/08/16 1902      PT LONG TERM GOAL #1   Title Pt will score <30% on QuickDash to promote increase in functional mobility   Baseline 8/28: QuickDash: 59.1   Time 4   Period Weeks   Status New     PT LONG TERM GOAL #2   Title Pt will achieve functional ROM for IR/ER of L UE with <2/10 pain so that she can wash her back   Baseline 8/28: pt  unable to wash her back at this time secondary to onset of shoulder pain   Time 4   Period Weeks   Status New     PT LONG TERM GOAL #3   Title Pt will perform HEP program to promote independent exercise and self management of UE/neck pain.   Baseline pt currently limited in activity tolerance/currently not exercising   Time 4   Period Weeks   Status New     PT LONG TERM GOAL #4   Title Pt will demonstrate functional posture and complete UE reaching/lifting tasks with <2/10 pain in shoulder/neck   Baseline pt unable to perform daily tasks such as opening jars, washing walls, etc. secondary to onset of pain   Time 4   Period Weeks   Status New            Plan - 04/23/16 1312    Clinical Impression Statement Pt arrives to PT clinic with minimal shoulder pain, with overall minimal shoulder pain experienced over the last week. She is able to perform resisted/weighted exercises including shoulder flexion/abduction, chest press, etc. this session with no c/o of shoulder pain. She continues to experience intermittent N/T and pain in L thumb secondary to carpal tunnel sx.   Rehab Potential Good   PT Frequency 2x / week   PT Duration 4 weeks   PT Treatment/Interventions ADLs/Self Care Home Management;Electrical Stimulation;Cryotherapy;Iontophoresis 4mg /ml Dexamethasone;Moist Heat;Functional mobility training;Therapeutic exercise;Therapeutic activities;Patient/family education;Manual techniques;Passive range of motion   PT Next Visit Plan progress shoulder mobility/strengthening ther ex   PT Home Exercise Plan see patient instructions   Consulted and Agree with Plan of Care Patient      Patient will benefit from skilled therapeutic intervention in order to improve the following deficits and impairments:  Decreased activity tolerance, Decreased mobility, Decreased range of motion, Decreased strength, Hypomobility, Increased muscle spasms, Impaired sensation, Impaired UE functional use,  Improper body mechanics, Postural dysfunction, Obesity, Pain  Visit Diagnosis: Cervicalgia  Abnormal posture  Pain in left shoulder  Pain in right shoulder     Problem List There are no active problems to display for this patient.  Pura Spice, PT, DPT # [phone] Mickel Baas Aniello Christopoulos SPT 04/23/2016, 1:15 PM  [place] [facility] [address] [place], [place], [address] Phone: [phone]   Fax:  [phone]  Name: Lamesha Elvis MRN: [id number] Date of Birth: 01-29-1951

## 2016-04-25 ENCOUNTER — Encounter: Payer: Self-pay | Admitting: Physical Therapy

## 2016-04-25 ENCOUNTER — Ambulatory Visit: Payer: BLUE CROSS/BLUE SHIELD | Admitting: Physical Therapy

## 2016-04-25 DIAGNOSIS — M542 Cervicalgia: Secondary | ICD-10-CM

## 2016-04-25 DIAGNOSIS — M25512 Pain in left shoulder: Secondary | ICD-10-CM

## 2016-04-25 DIAGNOSIS — R293 Abnormal posture: Secondary | ICD-10-CM

## 2016-04-25 DIAGNOSIS — M25511 Pain in right shoulder: Secondary | ICD-10-CM

## 2016-04-25 DIAGNOSIS — G542 Cervical root disorders, not elsewhere classified: Secondary | ICD-10-CM

## 2016-04-25 NOTE — Therapy (Signed)
[facility] [facility] [address] [place], [place], [address] Phone: [phone]   Fax:  [phone]  Physical Therapy Treatment  Patient Details  Name: Diana Roberts MRN: [id number] Date of Birth: [birth year] Referring Provider: Ballard Russell, MD  Encounter Date: 04/25/2016      PT End of Session - 04/25/16 1229    Visit Number 5   Number of Visits 8   Date for PT Re-Evaluation 05/06/16   PT Start Time 1032   PT Stop Time 1119   PT Time Calculation (min) 47 min   Activity Tolerance Patient tolerated treatment well   Behavior During Therapy [facility] for tasks assessed/performed      Past Medical History:  Diagnosis Date  . Anginal pain ([place])   . Anxiety   . Arthritis   . Depression   . Diabetes mellitus without complication ([place])   . Headache    migraines  . History of hiatal hernia   . Hypercholesteremia   . Hypertension   . Seasonal allergies   . Sleep apnea    Report - Care everywhere - 06/09/2014    Past Surgical History:  Procedure Laterality Date  . CARPAL TUNNEL RELEASE    . CATARACT EXTRACTION W/PHACO Left 02/08/2015   Procedure: CATARACT EXTRACTION PHACO AND INTRAOCULAR LENS PLACEMENT (IOC);  Surgeon: Leandrew Koyanagi, MD;  Location: [place];  Service: Ophthalmology;  Laterality: Left;  DIABETIC  . CESAREAN SECTION    . HERNIA REPAIR    . KNEE ARTHROSCOPY      There were no vitals filed for this visit.      Subjective Assessment - 04/25/16 1217    Subjective Pt states that she has been feeling well since her last appointment and reports no shoulder/neck pain with improved N/T in L hand with increased frequency of wrist bracing.    Pertinent History Pt fell approximately 1 month ago and has experienced bilateral shoulder pain and N/T since then.   Limitations Lifting;House hold activities   Patient Stated Goals Pt would like to have a reduction in neck/arm/hand pain and increase strength  in L hand/shoulder so that she can resume normal activities   Currently in Pain? No/denies     Objective:   Therapeutic Exercise: UBE f/b 3 minutes ea. Horizontal shoulder abduction RTB 2x10, diagonal 2x10 ea. way (cueing for shrug). Shoulder flexion with wand #1.5 x30. Shoulder abduction #1 x30. Scaption #2 x30. Pec minor stretch 30 seconds x2 R/L. Nautilus: Lat pull down #20 x30. Shoulder ext #10 x30. Tricep ext #10 x30. Shoulder add #10 x30.   Pt response for medical necessity: Pt with good tolerance of therapeutic exercise this session with no episodes of shoulder pain. She has mild pec minor tightness R greater than L. Requires intermittent cueing for posture and tendency for shoulder shrug.       PT Long Term Goals - 04/08/16 1902      PT LONG TERM GOAL #1   Title Pt will score <30% on QuickDash to promote increase in functional mobility   Baseline 8/28: QuickDash: 59.1   Time 4   Period Weeks   Status New     PT LONG TERM GOAL #2   Title Pt will achieve functional ROM for IR/ER of L UE with <2/10 pain so that she can wash her back   Baseline 8/28: pt unable to wash her back at this time secondary to onset of shoulder pain   Time 4  Period Weeks   Status New     PT LONG TERM GOAL #3   Title Pt will perform HEP program to promote independent exercise and self management of UE/neck pain.   Baseline pt currently limited in activity tolerance/currently not exercising   Time 4   Period Weeks   Status New     PT LONG TERM GOAL #4   Title Pt will demonstrate functional posture and complete UE reaching/lifting tasks with <2/10 pain in shoulder/neck   Baseline pt unable to perform daily tasks such as opening jars, washing walls, etc. secondary to onset of pain   Time 4   Period Weeks   Status New               Plan - 04/25/16 1229    Clinical Impression Statement Pt demonstrates good tolerance of higher intensity shoulder strengthening exercise. She has no pain  during therapy session but has tendency for mild shoulder shrug during resisted flexion/abduction. She demonstrates greater R than L pec minor tightness when performing doorway stretch. Continues to progress well through strengthening/mobility program.   Rehab Potential Good   PT Frequency 2x / week   PT Duration 4 weeks   PT Treatment/Interventions ADLs/Self Care Home Management;Electrical Stimulation;Cryotherapy;Iontophoresis 4mg /ml Dexamethasone;Moist Heat;Functional mobility training;Therapeutic exercise;Therapeutic activities;Patient/family education;Manual techniques;Passive range of motion   PT Next Visit Plan progress shoulder mobility/strengthening ther ex   PT Home Exercise Plan see patient instructions   Consulted and Agree with Plan of Care Patient      Patient will benefit from skilled therapeutic intervention in order to improve the following deficits and impairments:  Decreased activity tolerance, Decreased mobility, Decreased range of motion, Decreased strength, Hypomobility, Increased muscle spasms, Impaired sensation, Impaired UE functional use, Improper body mechanics, Postural dysfunction, Obesity, Pain  Visit Diagnosis: Cervicalgia  Abnormal posture  Pain in left shoulder  Pain in right shoulder  Cervical root disorders, not elsewhere classified     Problem List There are no active problems to display for this patient.  Pura Spice, PT, DPT # [phone] Mickel Baas Jameeka Marcy SPT 04/25/2016, 12:31 PM  [place] [facility] [address] [place], [place], [address] Phone: [phone]   Fax:  [phone]  Name: Diana Roberts MRN: [id number] Date of Birth: September 02, 1950

## 2016-04-30 ENCOUNTER — Encounter: Payer: Self-pay | Admitting: Physical Therapy

## 2016-04-30 ENCOUNTER — Ambulatory Visit: Payer: BLUE CROSS/BLUE SHIELD | Admitting: Physical Therapy

## 2016-04-30 DIAGNOSIS — G542 Cervical root disorders, not elsewhere classified: Secondary | ICD-10-CM

## 2016-04-30 DIAGNOSIS — M542 Cervicalgia: Secondary | ICD-10-CM

## 2016-04-30 DIAGNOSIS — M25511 Pain in right shoulder: Secondary | ICD-10-CM

## 2016-04-30 DIAGNOSIS — M25512 Pain in left shoulder: Secondary | ICD-10-CM

## 2016-04-30 DIAGNOSIS — R293 Abnormal posture: Secondary | ICD-10-CM

## 2016-04-30 NOTE — Therapy (Signed)
[place] [facility] [address] [place], [place], [address] Phone: [phone]   Fax:  [phone]  Physical Therapy Treatment  Patient Details  Name: Diana Roberts MRN: [id number] Date of Birth: 01-21-1951 Referring Provider: Ballard Russell, MD  Encounter Date: 04/30/2016      PT End of Session - 04/30/16 1245    Visit Number 6   Number of Visits 8   Date for PT Re-Evaluation 05/06/16   PT Start Time [id number]   PT Stop Time 1116   PT Time Calculation (min) 44 min   Activity Tolerance Patient tolerated treatment well   Behavior During Therapy [facility] for tasks assessed/performed      Past Medical History:  Diagnosis Date  . Anginal pain ([place])   . Anxiety   . Arthritis   . Depression   . Diabetes mellitus without complication ([place])   . Headache    migraines  . History of hiatal hernia   . Hypercholesteremia   . Hypertension   . Seasonal allergies   . Sleep apnea    Report - Care everywhere - 06/09/2014    Past Surgical History:  Procedure Laterality Date  . CARPAL TUNNEL RELEASE    . CATARACT EXTRACTION W/PHACO Left 02/08/2015   Procedure: CATARACT EXTRACTION PHACO AND INTRAOCULAR LENS PLACEMENT (IOC);  Surgeon: Leandrew Koyanagi, MD;  Location: [place];  Service: Ophthalmology;  Laterality: Left;  DIABETIC  . CESAREAN SECTION    . HERNIA REPAIR    . KNEE ARTHROSCOPY      There were no vitals filed for this visit.      Subjective Assessment - 04/30/16 1241    Subjective Pt denies any recent shoulder pain but notes that her neck is hurting her following a day of housework.    Patient is accompained by: Family member   Pertinent History Pt fell approximately 1 month ago and has experienced bilateral shoulder pain and N/T since then.   Diagnostic tests No imaging   Patient Stated Goals Pt would like to have a reduction in neck/arm/hand pain and increase strength in L hand/shoulder so that she can  resume normal activities   Currently in Pain? Yes   Pain Score 4    Pain Location Neck   Pain Orientation Lower   Pain Descriptors / Indicators Aching;Constant   Pain Frequency Constant     Objective:    Manual tx: STM to R/L thoracic paraspinals, levator scap and upper trapezius secondary to mild muscle tension/pain. Pt reports decrease in overall pain to 2/10 following STM.  Therapeutic Exercise: Pec minor stretch 1 minute R/L. Scapular retractions x30. Thoracic extensions in seated x15 with 5 sec holds. Cervical retraction in seated x10 3 sec holds.   Pt response for medical necessity: Pt does not complain of shoulder pain this session and states majority of pain is located in lower neck/upper back. She gets relief from [facility] of affected area and tolerates therapeutic exercise with no exacerbation of sx. Pt has demonstrated overall improvement with shoulder mobility/strengthening program and continues to perform HEP exercises at home.       PT Education - 04/30/16 1244    Education provided Yes   Education Details see patient instructions; thoracic extension, pec stretch, cervical retractions   Person(s) Educated Patient   Methods Explanation;Demonstration;Handout   Comprehension Verbalized understanding;Returned demonstration             PT Long Term Goals - 04/08/16 1902  PT LONG TERM GOAL #1   Title Pt will score <30% on QuickDash to promote increase in functional mobility   Baseline 8/28: QuickDash: 59.1   Time 4   Period Weeks   Status New     PT LONG TERM GOAL #2   Title Pt will achieve functional ROM for IR/ER of L UE with <2/10 pain so that she can wash her back   Baseline 8/28: pt unable to wash her back at this time secondary to onset of shoulder pain   Time 4   Period Weeks   Status New     PT LONG TERM GOAL #3   Title Pt will perform HEP program to promote independent exercise and self management of UE/neck pain.   Baseline pt currently limited in  activity tolerance/currently not exercising   Time 4   Period Weeks   Status New     PT LONG TERM GOAL #4   Title Pt will demonstrate functional posture and complete UE reaching/lifting tasks with <2/10 pain in shoulder/neck   Baseline pt unable to perform daily tasks such as opening jars, washing walls, etc. secondary to onset of pain   Time 4   Period Weeks   Status New            Plan - 04/30/16 1246    Clinical Impression Statement Pt with no recent c/o of shoulder pain but arrives to PT session with moderate neck/upper back pain. Pt with mild tension in bilateral upper trapezius/thoracic paraspinals with moderate relief from STM of affected musculature. She demonstrates good understanding of therapeutic exercise geared towards upright posture and relaxing tension on upper trapezius/thoracic paraspinals.   Rehab Potential Good   PT Frequency 2x / week   PT Duration 4 weeks   PT Treatment/Interventions ADLs/Self Care Home Management;Electrical Stimulation;Cryotherapy;Iontophoresis 4mg /ml Dexamethasone;Moist Heat;Functional mobility training;Therapeutic exercise;Therapeutic activities;Patient/family education;Manual techniques;Passive range of motion   PT Next Visit Plan progress shoulder mobility/strengthening ther ex   PT Home Exercise Plan see patient instructions   Consulted and Agree with Plan of Care Patient      Patient will benefit from skilled therapeutic intervention in order to improve the following deficits and impairments:  Decreased activity tolerance, Decreased mobility, Decreased range of motion, Decreased strength, Hypomobility, Increased muscle spasms, Impaired sensation, Impaired UE functional use, Improper body mechanics, Postural dysfunction, Obesity, Pain  Visit Diagnosis: Cervicalgia  Abnormal posture  Pain in left shoulder  Pain in right shoulder  Cervical root disorders, not elsewhere classified     Problem List There are no active problems to  display for this patient.  Pura Spice, PT, DPT # [phone] Mickel Baas Corinne Goucher SPT 04/30/2016, 12:51 PM  [place] [facility] [address] [place], [place], [address] Phone: [phone]   Fax:  [phone]  Name: Diana Roberts MRN: [id number] Date of Birth: 1950-10-27

## 2016-05-02 ENCOUNTER — Ambulatory Visit: Payer: BLUE CROSS/BLUE SHIELD | Admitting: Physical Therapy

## 2016-05-02 ENCOUNTER — Encounter: Payer: Self-pay | Admitting: Physical Therapy

## 2016-05-02 DIAGNOSIS — M542 Cervicalgia: Secondary | ICD-10-CM

## 2016-05-02 DIAGNOSIS — M25512 Pain in left shoulder: Secondary | ICD-10-CM

## 2016-05-02 DIAGNOSIS — M25511 Pain in right shoulder: Secondary | ICD-10-CM

## 2016-05-02 DIAGNOSIS — R293 Abnormal posture: Secondary | ICD-10-CM

## 2016-05-02 DIAGNOSIS — G542 Cervical root disorders, not elsewhere classified: Secondary | ICD-10-CM

## 2016-05-02 NOTE — Therapy (Signed)
[facility] [address] [place], [place], [address] Phone: [phone]   Fax:  [phone]  Physical Therapy Treatment/Discharge  Patient Details  Name: Diana Roberts MRN: [id number] Date of Birth: Mar 10, 1951 Referring Provider: Ballard Russell, MD  Encounter Date: 05/02/2016      PT End of Session - 05/02/16 1522    Visit Number 7   Number of Visits 8   Date for PT Re-Evaluation 05/06/16   PT Start Time 1032   PT Stop Time 1119   PT Time Calculation (min) 47 min   Activity Tolerance Patient tolerated treatment well   Behavior During Therapy [facility] for tasks assessed/performed      Past Medical History:  Diagnosis Date  . Anginal pain ([place])   . Anxiety   . Arthritis   . Depression   . Diabetes mellitus without complication ([place])   . Headache    migraines  . History of hiatal hernia   . Hypercholesteremia   . Hypertension   . Seasonal allergies   . Sleep apnea    Report - Care everywhere - 06/09/2014    Past Surgical History:  Procedure Laterality Date  . CARPAL TUNNEL RELEASE    . CATARACT EXTRACTION W/PHACO Left 02/08/2015   Procedure: CATARACT EXTRACTION PHACO AND INTRAOCULAR LENS PLACEMENT (IOC);  Surgeon: Leandrew Koyanagi, MD;  Location: [place];  Service: Ophthalmology;  Laterality: Left;  DIABETIC  . CESAREAN SECTION    . HERNIA REPAIR    . KNEE ARTHROSCOPY      There were no vitals filed for this visit.      Subjective Assessment - 05/02/16 1515    Subjective Pt states that she has been feeling much better with no c/o of neck or shoulder pain. She is agreeable to discontinuing PT at this time.   Pertinent History Pt fell approximately 1 month ago and has experienced bilateral shoulder pain and N/T since then.   Limitations Lifting;House hold activities   Diagnostic tests No imaging   Patient Stated Goals Pt would like to have a reduction in neck/arm/hand pain and increase  strength in L hand/shoulder so that she can resume normal activities   Currently in Pain? No/denies      Objective:   Therapeutic Exercise: Shoulder flexion to 90 deg #1 x10, #2 2x10. Shoulder abduction 2x15 #1. Rows with RTB 2x15. Shoulder extension with RTB 2x15. Bicep curls RTB 2x10, #3 1x10. UE Diagonals including D1 x20 R/L, x10 #1 R/L; D2 x20 R/L x10#1.    Pt response for medical necessity: Pt demonstrates overall improvement of functional mobility with no c/o of neck/bilateral shoulder pain this session. She performs UE strengthening/postural stability program without tendency for compensation and will progress independently at home with HEP.      PT Education - 05/02/16 1522    Education provided Yes   Education Details see patient instructions: UE exercise program with emphasis on postural stability   Person(s) Educated Patient   Methods Explanation;Demonstration;Handout   Comprehension Verbalized understanding;Returned demonstration             PT Long Term Goals - 05/02/16 1524      PT LONG TERM GOAL #1   Title Pt will score <30% on QuickDash to promote increase in functional mobility   Baseline 8/28: QuickDash: 59.1 9/21: 27%   Time 4   Period Weeks   Status Achieved     PT LONG TERM GOAL #2   Title Pt  will achieve functional ROM for IR/ER of L UE with <2/10 pain so that she can wash her back   Baseline 8/28: pt unable to wash her back at this time secondary to onset of shoulder pain   Time 4   Period Weeks   Status Achieved     PT LONG TERM GOAL #3   Title Pt will perform HEP program to promote independent exercise and self management of UE/neck pain.   Baseline pt currently limited in activity tolerance/currently not exercising   Time 4   Period Weeks   Status Achieved     PT LONG TERM GOAL #4   Title Pt will demonstrate functional posture and complete UE reaching/lifting tasks with <2/10 pain in shoulder/neck   Baseline pt unable to perform daily  tasks such as opening jars, washing walls, etc. secondary to onset of pain   Time 4   Period Weeks   Status Achieved               Plan - 05/02/16 1523    Clinical Impression Statement Pt demonstrates good understanding and tolerance of UE strengthening/postural stability home program. She experiences no neck/shoulder pain throughout session and will progress toward independent management of sx/strengthening at this time. Pt will f/u with PT as needed.   Rehab Potential Good   PT Frequency 2x / week   PT Duration 4 weeks   PT Treatment/Interventions ADLs/Self Care Home Management;Electrical Stimulation;Cryotherapy;Iontophoresis 4mg /ml Dexamethasone;Moist Heat;Functional mobility training;Therapeutic exercise;Therapeutic activities;Patient/family education;Manual techniques;Passive range of motion   PT Next Visit Plan progress shoulder mobility/strengthening ther ex   PT Home Exercise Plan see patient instructions   Consulted and Agree with Plan of Care Patient      Patient will benefit from skilled therapeutic intervention in order to improve the following deficits and impairments:  Decreased activity tolerance, Decreased mobility, Decreased range of motion, Decreased strength, Hypomobility, Increased muscle spasms, Impaired sensation, Impaired UE functional use, Improper body mechanics, Postural dysfunction, Obesity, Pain  Visit Diagnosis: Cervicalgia  Abnormal posture  Pain in left shoulder  Pain in right shoulder  Cervical root disorders, not elsewhere classified     Problem List There are no active problems to display for this patient.  Pura Spice, PT, DPT # [phone] Mickel Baas Javona Bergevin SPT 05/02/2016, 3:26 PM  [facility] [address] [place], [place], [address] Phone: [phone]   Fax:  [phone]  Name: Diana Roberts MRN: [id number] Date of Birth: [birth year]

## 2016-05-07 ENCOUNTER — Encounter: Payer: Disability Insurance | Admitting: Physical Therapy

## 2016-05-09 ENCOUNTER — Encounter: Payer: Disability Insurance | Admitting: Physical Therapy

## 2016-06-24 ENCOUNTER — Ambulatory Visit
Admission: EM | Admit: 2016-06-24 | Discharge: 2016-06-24 | Disposition: A | Discharge status: Home / Self Care | Payer: BLUE CROSS/BLUE SHIELD | Attending: Family Medicine | Admitting: Family Medicine

## 2016-06-24 DIAGNOSIS — G43009 Migraine without aura, not intractable, without status migrainosus: Secondary | ICD-10-CM

## 2016-06-24 MED ORDER — HYDROCODONE-ACETAMINOPHEN 5-325 MG PO TABS: 1.0000 | ORAL_TABLET | Freq: Four times a day (QID) | ORAL | 0 refills | Status: DC | PRN

## 2016-06-24 MED ORDER — ONDANSETRON 8 MG PO TBDP
8.0000 mg | ORAL_TABLET | Freq: Once | ORAL | Status: AC
  Administered 2016-06-24: 8 mg via ORAL

## 2016-06-24 MED ORDER — ONDANSETRON 8 MG PO TBDP: 8.0000 mg | ORAL_TABLET | Freq: Three times a day (TID) | ORAL | 0 refills | Status: DC | PRN

## 2016-06-24 MED ORDER — KETOROLAC TROMETHAMINE 60 MG/2ML IM SOLN
60.0000 mg | Freq: Once | INTRAMUSCULAR | Status: AC
  Administered 2016-06-24: 60 mg via INTRAMUSCULAR

## 2016-06-24 NOTE — ED Triage Notes (Addendum)
Pt c/o headache since Friday she has tried 800mg  Ibuprofen twice a day with no relief. Denies any medication changes. Also states she has been nauseous and vomiting.

## 2016-06-24 NOTE — ED Provider Notes (Signed)
[facility]    CSN: [id number] Arrival date & time: 06/24/16  1032     History   Chief Complaint Chief Complaint  Patient presents with  . Headache    HPI Diana Roberts is a 65 y.o. female.   64 yo female with a c/o frontal headache associated with photophobia, nausea and vomiting for the past 3 days. Denies any fevers, chills, vision changes, numbness/tingling, "worst headache ever". States has a remote h/o migraines.     Headache    Past Medical History:  Diagnosis Date  . Anginal pain ([place])   . Anxiety   . Arthritis   . Depression   . Diabetes mellitus without complication ([place])   . Headache    migraines  . History of hiatal hernia   . Hypercholesteremia   . Hypertension   . Seasonal allergies   . Sleep apnea    Report - Care everywhere - 06/09/2014    There are no active problems to display for this patient.   Past Surgical History:  Procedure Laterality Date  . CARPAL TUNNEL RELEASE    . CATARACT EXTRACTION W/PHACO Left 02/08/2015   Procedure: CATARACT EXTRACTION PHACO AND INTRAOCULAR LENS PLACEMENT (IOC);  Surgeon: Leandrew Koyanagi, MD;  Location: [place];  Service: Ophthalmology;  Laterality: Left;  DIABETIC  . CESAREAN SECTION    . HERNIA REPAIR    . KNEE ARTHROSCOPY      OB History    No data available       Home Medications    Prior to Admission medications   Medication Sig Start Date End Date Taking? Authorizing Provider  albuterol (PROVENTIL HFA;VENTOLIN HFA) 108 (90 BASE) MCG/ACT inhaler Inhale 2 puffs into the lungs every 6 (six) hours as needed for wheezing or shortness of breath.   Yes Historical Provider, MD  aspirin EC 81 MG tablet Take 81 mg by mouth daily.   Yes Historical Provider, MD  buPROPion (WELLBUTRIN XL) 150 MG 24 hr tablet Take 150 mg by mouth daily.   Yes Historical Provider, MD  carvedilol (COREG) 6.25 MG tablet Take 6.25 mg by mouth 2 (two) times daily with a meal.   Yes Historical  Provider, MD  Cholecalciferol 2000 UNITS CAPS Take 2,000 Units by mouth daily.    Yes Historical Provider, MD  clonazePAM (KLONOPIN) 0.5 MG tablet Take 0.5 mg by mouth 2 (two) times daily.   Yes Historical Provider, MD  GARLIC PO Take 1 tablet by mouth at bedtime.    Yes Historical Provider, MD  glimepiride (AMARYL) 2 MG tablet Take 2 mg by mouth daily.   Yes Historical Provider, MD  hydrochlorothiazide (HYDRODIURIL) 12.5 MG tablet Take 12.5 mg by mouth daily.   Yes Historical Provider, MD  insulin aspart (NOVOLOG) 100 UNIT/ML injection Inject 30 Units into the skin daily with supper.    Yes Historical Provider, MD  insulin glargine (LANTUS) 100 UNIT/ML injection Inject 70 Units into the skin at bedtime.   Yes Historical Provider, MD  lisinopril (PRINIVIL,ZESTRIL) 40 MG tablet Take 20 mg by mouth daily.   Yes Historical Provider, MD  magnesium oxide (MAG-OX) 400 MG tablet Take 400 mg by mouth 2 (two) times daily.   Yes Historical Provider, MD  metFORMIN (GLUCOPHAGE) 1000 MG tablet Take 1,000 mg by mouth See admin instructions. 500 mg with breakfast, and 2000 mg with dinner   Yes Historical Provider, MD  Multiple Vitamin (MULTIVITAMIN) tablet Take 1 tablet by mouth daily.   Yes Historical  Provider, MD  omega-3 acid ethyl esters (LOVAZA) 1 G capsule Take 1 g by mouth at bedtime.   Yes Historical Provider, MD  oxybutynin (DITROPAN-XL) 5 MG 24 hr tablet Take 5 mg by mouth at bedtime.   Yes Historical Provider, MD  simvastatin (ZOCOR) 40 MG tablet Take 40 mg by mouth at bedtime.    Yes Historical Provider, MD  HYDROcodone-acetaminophen (NORCO/VICODIN) 5-325 MG tablet Take 1-2 tablets by mouth every 6 (six) hours as needed. 06/24/16   Norval Gable, MD  ondansetron (ZOFRAN ODT) 8 MG disintegrating tablet Take 1 tablet (8 mg total) by mouth every 8 (eight) hours as needed for nausea or vomiting. 06/24/16   Norval Gable, MD    Family History Family History  Problem Relation Age of Onset  . Cancer  Mother   . Aneurysm Father     Social History Social History  Substance Use Topics  . Smoking status: Never Smoker  . Smokeless tobacco: Never Used  . Alcohol use No     Allergies   Ciprofloxacin and Tylenol sinus severe [pseudoephed-apap-guaifenesin]   Review of Systems Review of Systems  Neurological: Positive for headaches.     Physical Exam Triage Vital Signs ED Triage Vitals  Enc Vitals Group     BP 06/24/16 1129 (!) 163/60     Pulse Rate 06/24/16 1129 80     Resp 06/24/16 1129 18     Temp 06/24/16 1129 99.6 F (37.6 C)     Temp Source 06/24/16 1129 Oral     SpO2 06/24/16 1129 97 %     Weight 06/24/16 1129 207 lb (93.9 kg)     Height 06/24/16 1129 4\' 10"  (1.473 m)     Head Circumference --      Peak Flow --      Pain Score 06/24/16 1125 5     Pain Loc --      Pain Edu? --      Excl. in [place]? --    No data found.   Updated Vital Signs BP (!) 163/60 (BP Location: Left Arm)   Pulse 80   Temp 99.6 F (37.6 C) (Oral)   Resp 18   Ht 4\' 10"  (1.473 m)   Wt 207 lb (93.9 kg)   SpO2 97%   BMI 43.26 kg/m   Visual Acuity Right Eye Distance:   Left Eye Distance:   Bilateral Distance:    Right Eye Near:   Left Eye Near:    Bilateral Near:     Physical Exam  Constitutional: She is oriented to person, place, and time. She appears well-developed and well-nourished. No distress.  HENT:  Head: Normocephalic.  Right Ear: Tympanic membrane, external ear and ear canal normal.  Left Ear: Tympanic membrane, external ear and ear canal normal.  Nose: Nose normal.  Mouth/Throat: Oropharynx is clear and moist and mucous membranes are normal.  Eyes: Conjunctivae and EOM are normal. Pupils are equal, round, and reactive to light. Right eye exhibits no discharge. Left eye exhibits no discharge. No scleral icterus.  Neck: Normal range of motion. Neck supple. No JVD present. No tracheal deviation present. No thyromegaly present.  Cardiovascular: Normal rate, regular  rhythm, normal heart sounds and intact distal pulses.   No murmur heard. Pulmonary/Chest: Effort normal and breath sounds normal. No stridor. No respiratory distress. She has no wheezes. She has no rales. She exhibits no tenderness.  Abdominal: Soft. Bowel sounds are normal. She exhibits no distension and no mass. There is  no tenderness. There is no rebound and no guarding.  Musculoskeletal: She exhibits no edema or tenderness.  Lymphadenopathy:    She has no cervical adenopathy.  Neurological: She is alert and oriented to person, place, and time. She has normal reflexes. She displays normal reflexes. No cranial nerve deficit or sensory deficit. She exhibits normal muscle tone. Coordination normal.  Skin: Skin is warm and dry. No rash noted. She is not diaphoretic. No erythema. No pallor.  Nursing note and vitals reviewed.    UC Treatments / Results  Labs (all labs ordered are listed, but only abnormal results are displayed) Labs Reviewed - No data to display  EKG  EKG Interpretation None       Radiology No results found.  Procedures Procedures (including critical care time)  Medications Ordered in UC Medications  ondansetron (ZOFRAN-ODT) disintegrating tablet 8 mg (8 mg Oral Given 06/24/16 1205)  ketorolac (TORADOL) injection 60 mg (60 mg Intramuscular Given 06/24/16 1206)     Initial Impression / Assessment and Plan / UC Course  I have reviewed the triage vital signs and the nursing notes.  Pertinent labs & imaging results that were available during my care of the patient were reviewed by me and considered in my medical decision making (see chart for details).  Clinical Course       Final Clinical Impressions(s) / UC Diagnoses   Final diagnoses:  Migraine without aura and without status migrainosus, not intractable    New Prescriptions Discharge Medication List as of 06/24/2016  1:06 PM    START taking these medications   Details  HYDROcodone-acetaminophen  (NORCO/VICODIN) 5-325 MG tablet Take 1-2 tablets by mouth every 6 (six) hours as needed., Starting Mon 06/24/2016, Print    ondansetron (ZOFRAN ODT) 8 MG disintegrating tablet Take 1 tablet (8 mg total) by mouth every 8 (eight) hours as needed for nausea or vomiting., Starting Mon 06/24/2016, Normal       1. diagnosis reviewed with patient 2. Patient given toradol 60mg  im x1 and zofran 8mg  po x 1 with improvement of symptoms 3.  rx as per orders above; reviewed possible side effects, interactions, risks and benefits   4. Follow-up prn if symptoms worsen or don't improve   Norval Gable, MD 06/24/16 2001

## 2016-06-25 DIAGNOSIS — G8929 Other chronic pain: Secondary | ICD-10-CM | POA: Insufficient documentation

## 2016-06-26 ENCOUNTER — Emergency Department: Payer: BLUE CROSS/BLUE SHIELD

## 2016-06-26 ENCOUNTER — Encounter: Payer: Self-pay | Admitting: *Deleted

## 2016-06-26 ENCOUNTER — Inpatient Hospital Stay
Admission: EM | Admit: 2016-06-26 | Discharge: 2016-06-28 | DRG: 193 | Disposition: A | Discharge status: Home / Self Care | Payer: BLUE CROSS/BLUE SHIELD | Attending: Internal Medicine | Admitting: Internal Medicine

## 2016-06-26 DIAGNOSIS — J0100 Acute maxillary sinusitis, unspecified: Secondary | ICD-10-CM

## 2016-06-26 DIAGNOSIS — E7800 Pure hypercholesterolemia, unspecified: Secondary | ICD-10-CM | POA: Diagnosis present

## 2016-06-26 DIAGNOSIS — G4730 Sleep apnea, unspecified: Secondary | ICD-10-CM | POA: Diagnosis present

## 2016-06-26 DIAGNOSIS — J9601 Acute respiratory failure with hypoxia: Secondary | ICD-10-CM | POA: Diagnosis present

## 2016-06-26 DIAGNOSIS — J189 Pneumonia, unspecified organism: Secondary | ICD-10-CM | POA: Diagnosis not present

## 2016-06-26 DIAGNOSIS — Z7982 Long term (current) use of aspirin: Secondary | ICD-10-CM

## 2016-06-26 DIAGNOSIS — F419 Anxiety disorder, unspecified: Secondary | ICD-10-CM | POA: Diagnosis present

## 2016-06-26 DIAGNOSIS — F329 Major depressive disorder, single episode, unspecified: Secondary | ICD-10-CM | POA: Diagnosis present

## 2016-06-26 DIAGNOSIS — I110 Hypertensive heart disease with heart failure: Secondary | ICD-10-CM | POA: Diagnosis present

## 2016-06-26 DIAGNOSIS — R0902 Hypoxemia: Secondary | ICD-10-CM

## 2016-06-26 DIAGNOSIS — R112 Nausea with vomiting, unspecified: Secondary | ICD-10-CM

## 2016-06-26 DIAGNOSIS — G43909 Migraine, unspecified, not intractable, without status migrainosus: Secondary | ICD-10-CM | POA: Diagnosis present

## 2016-06-26 DIAGNOSIS — Z881 Allergy status to other antibiotic agents status: Secondary | ICD-10-CM

## 2016-06-26 DIAGNOSIS — E119 Type 2 diabetes mellitus without complications: Secondary | ICD-10-CM | POA: Diagnosis present

## 2016-06-26 DIAGNOSIS — J302 Other seasonal allergic rhinitis: Secondary | ICD-10-CM | POA: Diagnosis present

## 2016-06-26 DIAGNOSIS — Z888 Allergy status to other drugs, medicaments and biological substances status: Secondary | ICD-10-CM

## 2016-06-26 DIAGNOSIS — I5022 Chronic systolic (congestive) heart failure: Secondary | ICD-10-CM | POA: Diagnosis present

## 2016-06-26 DIAGNOSIS — Z79899 Other long term (current) drug therapy: Secondary | ICD-10-CM

## 2016-06-26 DIAGNOSIS — K5900 Constipation, unspecified: Secondary | ICD-10-CM | POA: Diagnosis present

## 2016-06-26 DIAGNOSIS — Z794 Long term (current) use of insulin: Secondary | ICD-10-CM

## 2016-06-26 DIAGNOSIS — R51 Headache: Secondary | ICD-10-CM | POA: Diagnosis not present

## 2016-06-26 DIAGNOSIS — Z6841 Body Mass Index (BMI) 40.0 and over, adult: Secondary | ICD-10-CM

## 2016-06-26 NOTE — ED Triage Notes (Addendum)
Pt to triage via wheelchair. Pt has a headache, left earache and nausea.  Sx for 4 days.  Pt seen at urgent care this week and pt states nausea med not helping.  Pt also has a cough. nonsmoker Pt alert.  Speech clear.

## 2016-06-27 DIAGNOSIS — Z794 Long term (current) use of insulin: Secondary | ICD-10-CM | POA: Diagnosis not present

## 2016-06-27 DIAGNOSIS — J302 Other seasonal allergic rhinitis: Secondary | ICD-10-CM | POA: Diagnosis present

## 2016-06-27 DIAGNOSIS — E119 Type 2 diabetes mellitus without complications: Secondary | ICD-10-CM | POA: Diagnosis present

## 2016-06-27 DIAGNOSIS — J9601 Acute respiratory failure with hypoxia: Secondary | ICD-10-CM | POA: Diagnosis present

## 2016-06-27 DIAGNOSIS — K5900 Constipation, unspecified: Secondary | ICD-10-CM | POA: Diagnosis present

## 2016-06-27 DIAGNOSIS — E7800 Pure hypercholesterolemia, unspecified: Secondary | ICD-10-CM | POA: Diagnosis present

## 2016-06-27 DIAGNOSIS — G4730 Sleep apnea, unspecified: Secondary | ICD-10-CM | POA: Diagnosis present

## 2016-06-27 DIAGNOSIS — J189 Pneumonia, unspecified organism: Secondary | ICD-10-CM | POA: Diagnosis present

## 2016-06-27 DIAGNOSIS — Z881 Allergy status to other antibiotic agents status: Secondary | ICD-10-CM | POA: Diagnosis not present

## 2016-06-27 DIAGNOSIS — Z79899 Other long term (current) drug therapy: Secondary | ICD-10-CM | POA: Diagnosis not present

## 2016-06-27 DIAGNOSIS — R51 Headache: Secondary | ICD-10-CM | POA: Diagnosis present

## 2016-06-27 DIAGNOSIS — I110 Hypertensive heart disease with heart failure: Secondary | ICD-10-CM | POA: Diagnosis present

## 2016-06-27 DIAGNOSIS — F329 Major depressive disorder, single episode, unspecified: Secondary | ICD-10-CM | POA: Diagnosis present

## 2016-06-27 DIAGNOSIS — F419 Anxiety disorder, unspecified: Secondary | ICD-10-CM | POA: Diagnosis present

## 2016-06-27 DIAGNOSIS — I5022 Chronic systolic (congestive) heart failure: Secondary | ICD-10-CM | POA: Diagnosis present

## 2016-06-27 DIAGNOSIS — Z888 Allergy status to other drugs, medicaments and biological substances status: Secondary | ICD-10-CM | POA: Diagnosis not present

## 2016-06-27 DIAGNOSIS — Z6841 Body Mass Index (BMI) 40.0 and over, adult: Secondary | ICD-10-CM | POA: Diagnosis not present

## 2016-06-27 DIAGNOSIS — G43909 Migraine, unspecified, not intractable, without status migrainosus: Secondary | ICD-10-CM | POA: Diagnosis present

## 2016-06-27 DIAGNOSIS — Z7982 Long term (current) use of aspirin: Secondary | ICD-10-CM | POA: Diagnosis not present

## 2016-06-27 LAB — CBC WITH DIFFERENTIAL/PLATELET
Basophils Absolute: 0 10*3/uL (ref 0–0.1)
Basophils Relative: 0 %
EOS ABS: 0 10*3/uL (ref 0–0.7)
Eosinophils Relative: 0 %
HEMATOCRIT: 34.8 % — AB (ref 35.0–47.0)
HEMOGLOBIN: 11.8 g/dL — AB (ref 12.0–16.0)
LYMPHS ABS: 2 10*3/uL (ref 1.0–3.6)
LYMPHS PCT: 13 %
MCH: 30.8 pg (ref 26.0–34.0)
MCHC: 33.8 g/dL (ref 32.0–36.0)
MCV: 91.1 fL (ref 80.0–100.0)
MONOS PCT: 5 %
Monocytes Absolute: 0.8 10*3/uL (ref 0.2–0.9)
NEUTROS ABS: 12.2 10*3/uL — AB (ref 1.4–6.5)
NEUTROS PCT: 82 %
Platelets: 312 10*3/uL (ref 150–440)
RBC: 3.81 MIL/uL (ref 3.80–5.20)
RDW: 12.2 % (ref 11.5–14.5)
WBC: 15.1 10*3/uL — AB (ref 3.6–11.0)

## 2016-06-27 LAB — BASIC METABOLIC PANEL
Anion gap: 13 (ref 5–15)
BUN: 22 mg/dL — AB (ref 6–20)
CHLORIDE: 96 mmol/L — AB (ref 101–111)
CO2: 24 mmol/L (ref 22–32)
CREATININE: 0.98 mg/dL (ref 0.44–1.00)
Calcium: 8.9 mg/dL (ref 8.9–10.3)
GFR calc non Af Amer: >60 mL/min (ref >60)
Glucose, Bld: 392 mg/dL — ABNORMAL HIGH (ref 65–99)
POTASSIUM: 4.2 mmol/L (ref 3.5–5.1)
SODIUM: 133 mmol/L — AB (ref 135–145)

## 2016-06-27 LAB — GLUCOSE, CAPILLARY
Glucose-Capillary: 328 mg/dL — ABNORMAL HIGH (ref 65–99)
Glucose-Capillary: 253 mg/dL — ABNORMAL HIGH (ref 65–99)
Glucose-Capillary: 233 mg/dL — ABNORMAL HIGH (ref 65–99)
Glucose-Capillary: 316 mg/dL — ABNORMAL HIGH (ref 65–99)

## 2016-06-27 LAB — TSH: TSH: 1.225 u[IU]/mL (ref 0.350–4.500)

## 2016-06-27 MED ORDER — INSULIN GLARGINE 100 UNIT/ML ~~LOC~~ SOLN
50.0000 [IU] | Freq: Every day | SUBCUTANEOUS | Status: DC
  Filled 2016-06-27: qty 0.5

## 2016-06-27 MED ORDER — CLONAZEPAM 0.5 MG PO TABS
0.5000 mg | ORAL_TABLET | Freq: Two times a day (BID) | ORAL | Status: DC
  Administered 2016-06-27 – 2016-06-28 (×3): 0.5 mg via ORAL
  Filled 2016-06-27 (×3): qty 1

## 2016-06-27 MED ORDER — VITAMIN D 1000 UNITS PO TABS
2000.0000 [IU] | ORAL_TABLET | Freq: Every day | ORAL | Status: DC
  Administered 2016-06-27 – 2016-06-28 (×2): 2000 [IU] via ORAL
  Filled 2016-06-27 (×2): qty 2

## 2016-06-27 MED ORDER — ACETAMINOPHEN 325 MG PO TABS
650.0000 mg | ORAL_TABLET | Freq: Four times a day (QID) | ORAL | Status: DC | PRN
Start: 2016-06-27 — End: 2016-06-28
  Administered 2016-06-27 – 2016-06-28 (×2): 650 mg via ORAL
  Filled 2016-06-27 (×3): qty 2

## 2016-06-27 MED ORDER — INSULIN GLARGINE 100 UNIT/ML ~~LOC~~ SOLN
70.0000 [IU] | Freq: Every day | SUBCUTANEOUS | Status: DC
Start: 2016-06-27 — End: 2016-06-28
  Administered 2016-06-27: 70 [IU] via SUBCUTANEOUS
  Filled 2016-06-27 (×3): qty 0.7

## 2016-06-27 MED ORDER — ONDANSETRON HCL 4 MG/2ML IJ SOLN: 4.0000 mg | Freq: Four times a day (QID) | INTRAMUSCULAR | Status: DC | PRN

## 2016-06-27 MED ORDER — HYDROCODONE-ACETAMINOPHEN 5-325 MG PO TABS
1.0000 | ORAL_TABLET | Freq: Four times a day (QID) | ORAL | Status: DC | PRN
  Administered 2016-06-27: 1 via ORAL
  Filled 2016-06-27: qty 1

## 2016-06-27 MED ORDER — OMEGA-3-ACID ETHYL ESTERS 1 G PO CAPS
1.0000 g | ORAL_CAPSULE | Freq: Every day | ORAL | Status: DC
  Administered 2016-06-27: 1 g via ORAL
  Filled 2016-06-27: qty 1

## 2016-06-27 MED ORDER — DOCUSATE SODIUM 100 MG PO CAPS
100.0000 mg | ORAL_CAPSULE | Freq: Two times a day (BID) | ORAL | Status: DC
  Administered 2016-06-27 – 2016-06-28 (×3): 100 mg via ORAL
  Filled 2016-06-27 (×3): qty 1

## 2016-06-27 MED ORDER — SENNA 8.6 MG PO TABS
1.0000 | ORAL_TABLET | Freq: Every day | ORAL | Status: DC
  Administered 2016-06-28: 8.6 mg via ORAL
  Filled 2016-06-27: qty 1

## 2016-06-27 MED ORDER — AZITHROMYCIN 500 MG IV SOLR
500.0000 mg | Freq: Once | INTRAVENOUS | Status: AC
  Administered 2016-06-27: 500 mg via INTRAVENOUS
  Filled 2016-06-27: qty 500

## 2016-06-27 MED ORDER — ACETAMINOPHEN 650 MG RE SUPP: 650.0000 mg | Freq: Four times a day (QID) | RECTAL | Status: DC | PRN

## 2016-06-27 MED ORDER — SIMVASTATIN 40 MG PO TABS
40.0000 mg | ORAL_TABLET | Freq: Every day | ORAL | Status: DC
  Administered 2016-06-27: 40 mg via ORAL
  Filled 2016-06-27: qty 1

## 2016-06-27 MED ORDER — ONDANSETRON HCL 4 MG PO TABS: 4.0000 mg | ORAL_TABLET | Freq: Four times a day (QID) | ORAL | Status: DC | PRN

## 2016-06-27 MED ORDER — BUPROPION HCL ER (XL) 150 MG PO TB24
150.0000 mg | ORAL_TABLET | Freq: Every day | ORAL | Status: DC
  Administered 2016-06-27 – 2016-06-28 (×2): 150 mg via ORAL
  Filled 2016-06-27 (×2): qty 1

## 2016-06-27 MED ORDER — METOCLOPRAMIDE HCL 5 MG/ML IJ SOLN
10.0000 mg | Freq: Once | INTRAMUSCULAR | Status: AC
  Administered 2016-06-27: 10 mg via INTRAVENOUS

## 2016-06-27 MED ORDER — METOCLOPRAMIDE HCL 5 MG/ML IJ SOLN
INTRAMUSCULAR | Status: AC
  Administered 2016-06-27: 10 mg via INTRAVENOUS
  Filled 2016-06-27: qty 2

## 2016-06-27 MED ORDER — DEXTROSE 5 % IV SOLN: 1.0000 g | Freq: Once | INTRAVENOUS | Status: DC

## 2016-06-27 MED ORDER — CARVEDILOL 6.25 MG PO TABS
6.2500 mg | ORAL_TABLET | Freq: Two times a day (BID) | ORAL | Status: DC
  Administered 2016-06-27 – 2016-06-28 (×3): 6.25 mg via ORAL
  Filled 2016-06-27 (×4): qty 1

## 2016-06-27 MED ORDER — ACETAMINOPHEN 325 MG PO TABS
ORAL_TABLET | ORAL | Status: AC
  Administered 2016-06-27: 650 mg via ORAL
  Filled 2016-06-27: qty 2

## 2016-06-27 MED ORDER — INSULIN ASPART 100 UNIT/ML ~~LOC~~ SOLN
0.0000 [IU] | Freq: Three times a day (TID) | SUBCUTANEOUS | Status: DC
  Administered 2016-06-27: 7 [IU] via SUBCUTANEOUS
  Administered 2016-06-27: 15 [IU] via SUBCUTANEOUS
  Administered 2016-06-27: 11 [IU] via SUBCUTANEOUS
  Administered 2016-06-28: 15 [IU] via SUBCUTANEOUS
  Administered 2016-06-28: 7 [IU] via SUBCUTANEOUS
  Filled 2016-06-27 (×2): qty 15
  Filled 2016-06-27: qty 7
  Filled 2016-06-27: qty 11
  Filled 2016-06-27: qty 7

## 2016-06-27 MED ORDER — ENOXAPARIN SODIUM 40 MG/0.4ML ~~LOC~~ SOLN
40.0000 mg | Freq: Two times a day (BID) | SUBCUTANEOUS | Status: DC
  Administered 2016-06-27 – 2016-06-28 (×3): 40 mg via SUBCUTANEOUS
  Filled 2016-06-27 (×3): qty 0.4

## 2016-06-27 MED ORDER — CEFTRIAXONE SODIUM-DEXTROSE 1-3.74 GM-% IV SOLR
1.0000 g | Freq: Once | INTRAVENOUS | Status: AC
  Administered 2016-06-27: 1 g via INTRAVENOUS
  Filled 2016-06-27: qty 50

## 2016-06-27 MED ORDER — BISACODYL 10 MG RE SUPP
10.0000 mg | Freq: Every day | RECTAL | Status: DC
  Filled 2016-06-27: qty 1

## 2016-06-27 MED ORDER — MAGNESIUM OXIDE 400 (241.3 MG) MG PO TABS
400.0000 mg | ORAL_TABLET | Freq: Two times a day (BID) | ORAL | Status: DC
  Administered 2016-06-27 – 2016-06-28 (×3): 400 mg via ORAL
  Filled 2016-06-27 (×3): qty 1

## 2016-06-27 MED ORDER — OXYBUTYNIN CHLORIDE ER 5 MG PO TB24
5.0000 mg | ORAL_TABLET | Freq: Every day | ORAL | Status: DC
  Administered 2016-06-27: 5 mg via ORAL
  Filled 2016-06-27 (×3): qty 1

## 2016-06-27 MED ORDER — ADULT MULTIVITAMIN W/MINERALS CH
1.0000 | ORAL_TABLET | Freq: Every day | ORAL | Status: DC
  Administered 2016-06-27 – 2016-06-28 (×2): 1 via ORAL
  Filled 2016-06-27 (×2): qty 1

## 2016-06-27 MED ORDER — AMOXICILLIN 500 MG PO CAPS
1000.0000 mg | ORAL_CAPSULE | Freq: Two times a day (BID) | ORAL | Status: DC
  Administered 2016-06-27 – 2016-06-28 (×3): 1000 mg via ORAL
  Filled 2016-06-27 (×4): qty 2

## 2016-06-27 MED ORDER — ASPIRIN EC 81 MG PO TBEC
81.0000 mg | DELAYED_RELEASE_TABLET | Freq: Every day | ORAL | Status: DC
  Administered 2016-06-27 – 2016-06-28 (×2): 81 mg via ORAL
  Filled 2016-06-27 (×2): qty 1

## 2016-06-27 MED ORDER — ACETAMINOPHEN 325 MG PO TABS
650.0000 mg | ORAL_TABLET | Freq: Once | ORAL | Status: AC
  Administered 2016-06-27: 650 mg via ORAL

## 2016-06-27 MED ORDER — AZITHROMYCIN 250 MG PO TABS
250.0000 mg | ORAL_TABLET | Freq: Every day | ORAL | Status: DC
  Administered 2016-06-28: 250 mg via ORAL
  Filled 2016-06-27: qty 1

## 2016-06-27 MED ORDER — HYDROCHLOROTHIAZIDE 25 MG PO TABS
12.5000 mg | ORAL_TABLET | Freq: Every day | ORAL | Status: DC
  Administered 2016-06-27 – 2016-06-28 (×2): 12.5 mg via ORAL
  Filled 2016-06-27 (×2): qty 1

## 2016-06-27 MED ORDER — LISINOPRIL 20 MG PO TABS
20.0000 mg | ORAL_TABLET | Freq: Every day | ORAL | Status: DC
Start: 2016-06-27 — End: 2016-06-28
  Administered 2016-06-27 – 2016-06-28 (×2): 20 mg via ORAL
  Filled 2016-06-27 (×2): qty 1

## 2016-06-27 MED ORDER — SODIUM CHLORIDE 0.9 % IV BOLUS (SEPSIS)
1000.0000 mL | Freq: Once | INTRAVENOUS | Status: AC
  Administered 2016-06-27: 1000 mL via INTRAVENOUS

## 2016-06-27 MED ORDER — ONDANSETRON HCL 4 MG/2ML IJ SOLN
4.0000 mg | Freq: Once | INTRAMUSCULAR | Status: AC
  Administered 2016-06-27: 4 mg via INTRAVENOUS
  Filled 2016-06-27: qty 2

## 2016-06-27 NOTE — Progress Notes (Signed)
Lovenox changed to 40 mg bid for BMI >40.

## 2016-06-27 NOTE — ED Notes (Signed)
Dr Schaevitz and Butch, RN at bedside at this time. 

## 2016-06-27 NOTE — ED Provider Notes (Signed)
[facility] Emergency Department Provider Note  ____________________________________________   First MD Initiated Contact with Patient 06/27/16 0003     (approximate)  I have reviewed the triage vital signs and the nursing notes.   HISTORY  Chief Complaint Emesis; Otalgia; and Headache   HPI Diana Roberts is a 65 y.o. female with a history of diabetes as well as migraine headaches and hypertension is present to emergency department today with a frontal headache as well as cough and runny nose. She says that her symptoms have been ongoing since this past Sunday. She says that the headache is frontal of her forehead as well as below her eyes and is an 8 out of 10. She says that she also has nasal congestion as well as vomiting and cough. She denies any fever but says that she has felt cold at home. Denies any diarrhea. Denies any abdominal pain. Denies any vision changes. Was seen in urgent care on the 13th and was diagnosed with a migraine headache at that time. She also saw her primary care doctor's office who thought that she had TMJ because of frontal headache as well as jaw pain especially to the left side at the temporomandibular joint. Patient with multiple episodes of vomiting and says that she has only been able to tolerate a small amount of water over the past 4 days. Has been taking Zofran ODT without any relief. Does not report any neck pain.   Past Medical History:  Diagnosis Date  . Anginal pain ([place])   . Anxiety   . Arthritis   . Depression   . Diabetes mellitus without complication ([place])   . Headache    migraines  . History of hiatal hernia   . Hypercholesteremia   . Hypertension   . Seasonal allergies   . Sleep apnea    Report - Care everywhere - 06/09/2014    There are no active problems to display for this patient.   Past Surgical History:  Procedure Laterality Date  . CARPAL TUNNEL RELEASE    . CATARACT EXTRACTION W/PHACO  Left 02/08/2015   Procedure: CATARACT EXTRACTION PHACO AND INTRAOCULAR LENS PLACEMENT (IOC);  Surgeon: Leandrew Koyanagi, MD;  Location: [facility];  Service: Ophthalmology;  Laterality: Left;  DIABETIC  . CESAREAN SECTION    . HERNIA REPAIR    . KNEE ARTHROSCOPY      Prior to Admission medications   Medication Sig Start Date End Date Taking? Authorizing Provider  albuterol (PROVENTIL HFA;VENTOLIN HFA) 108 (90 BASE) MCG/ACT inhaler Inhale 2 puffs into the lungs every 6 (six) hours as needed for wheezing or shortness of breath.    Historical Provider, MD  aspirin EC 81 MG tablet Take 81 mg by mouth daily.    Historical Provider, MD  buPROPion (WELLBUTRIN XL) 150 MG 24 hr tablet Take 150 mg by mouth daily.    Historical Provider, MD  carvedilol (COREG) 6.25 MG tablet Take 6.25 mg by mouth 2 (two) times daily with a meal.    Historical Provider, MD  Cholecalciferol 2000 UNITS CAPS Take 2,000 Units by mouth daily.     Historical Provider, MD  clonazePAM (KLONOPIN) 0.5 MG tablet Take 0.5 mg by mouth 2 (two) times daily.    Historical Provider, MD  GARLIC PO Take 1 tablet by mouth at bedtime.     Historical Provider, MD  glimepiride (AMARYL) 2 MG tablet Take 2 mg by mouth daily.    Historical Provider, MD  hydrochlorothiazide (  HYDRODIURIL) 12.5 MG tablet Take 12.5 mg by mouth daily.    Historical Provider, MD  HYDROcodone-acetaminophen (NORCO/VICODIN) 5-325 MG tablet Take 1-2 tablets by mouth every 6 (six) hours as needed. 06/24/16   Norval Gable, MD  insulin aspart (NOVOLOG) 100 UNIT/ML injection Inject 30 Units into the skin daily with supper.     Historical Provider, MD  insulin glargine (LANTUS) 100 UNIT/ML injection Inject 70 Units into the skin at bedtime.    Historical Provider, MD  lisinopril (PRINIVIL,ZESTRIL) 40 MG tablet Take 20 mg by mouth daily.    Historical Provider, MD  magnesium oxide (MAG-OX) 400 MG tablet Take 400 mg by mouth 2 (two) times daily.    Historical Provider,  MD  metFORMIN (GLUCOPHAGE) 1000 MG tablet Take 1,000 mg by mouth See admin instructions. 500 mg with breakfast, and 2000 mg with dinner    Historical Provider, MD  Multiple Vitamin (MULTIVITAMIN) tablet Take 1 tablet by mouth daily.    Historical Provider, MD  omega-3 acid ethyl esters (LOVAZA) 1 G capsule Take 1 g by mouth at bedtime.    Historical Provider, MD  ondansetron (ZOFRAN ODT) 8 MG disintegrating tablet Take 1 tablet (8 mg total) by mouth every 8 (eight) hours as needed for nausea or vomiting. 06/24/16   Norval Gable, MD  oxybutynin (DITROPAN-XL) 5 MG 24 hr tablet Take 5 mg by mouth at bedtime.    Historical Provider, MD  simvastatin (ZOCOR) 40 MG tablet Take 40 mg by mouth at bedtime.     Historical Provider, MD    Allergies Ciprofloxacin and Tylenol sinus severe [pseudoephed-apap-guaifenesin]  Family History  Problem Relation Age of Onset  . Cancer Mother   . Aneurysm Father     Social History Social History  Substance Use Topics  . Smoking status: Never Smoker  . Smokeless tobacco: Never Used  . Alcohol use No    Review of Systems Constitutional: Chills Eyes: No visual changes. ENT: No sore throat. Cardiovascular: Denies chest pain. Respiratory: Nonproductive cough Gastrointestinal: No abdominal pain.  no diarrhea.  No constipation. Genitourinary: Negative for dysuria. Musculoskeletal: Negative for back pain. Skin: Negative for rash. Neurological: Negative for focal weakness or numbness.  10-point ROS otherwise negative.  ____________________________________________   PHYSICAL EXAM:  VITAL SIGNS: ED Triage Vitals  Enc Vitals Group     BP 06/26/16 2249 (!) 143/50     Pulse Rate 06/26/16 2249 78     Resp 06/26/16 2249 20     Temp 06/26/16 2249 99.6 F (37.6 C)     Temp Source 06/26/16 2249 Oral     SpO2 06/26/16 2249 94 %     Weight 06/26/16 2252 202 lb (91.6 kg)     Height 06/26/16 2252 4\' 10"  (1.473 m)     Head Circumference --      Peak Flow  --      Pain Score 06/26/16 2252 10     Pain Loc --      Pain Edu? --      Excl. in [place]? --     Constitutional: Alert and oriented. Patient with several episodes of vomiting while I was in the room. Nonbloody. Eyes: Conjunctivae are normal. PERRL. EOMI. Head: Atraumatic.Tenderness over the maxillary sinuses bilaterally. No tenderness over the frontal sinus. No nodularity along the distribution of the temporal arteries bilaterally. TMs bilaterally which appear to have serous fluid behind them but without any erythema.  Mild bulging to the left sided TM. Nose: Mild rhinorrhea to the  bilateral nares. Mouth/Throat: Mucous membranes are moist.   Neck: No stridor.   Cardiovascular: Normal rate, regular rhythm. Grossly normal heart sounds.   Respiratory: Normal respiratory effort.  No retractions. Lungs CTAB. Gastrointestinal: Soft and nontender. No distention. No abdominal bruits. No CVA tenderness. Musculoskeletal: No lower extremity tenderness nor edema.  No joint effusions. Neurologic:  Normal speech and language. No gross focal neurologic deficits are appreciated. Skin:  Skin is warm, dry and intact. No rash noted. Psychiatric: Mood and affect are normal. Speech and behavior are normal.  ____________________________________________   LABS (all labs ordered are listed, but only abnormal results are displayed)  Labs Reviewed  CBC WITH DIFFERENTIAL/PLATELET - Abnormal; Notable for the following:       Result Value   WBC 15.1 (*)    Hemoglobin 11.8 (*)    HCT 34.8 (*)    Neutro Abs 12.2 (*)    All other components within normal limits  BASIC METABOLIC PANEL - Abnormal; Notable for the following:    Sodium 133 (*)    Chloride 96 (*)    Glucose, Bld 392 (*)    BUN 22 (*)    All other components within normal limits   ____________________________________________  EKG  ED ECG REPORT I, Doran Stabler, the attending physician, personally viewed and interpreted this ECG.    Date: 06/27/2016  EKG Time: 0208  Rate: 81  Rhythm: normal sinus rhythm  Axis: Normal axis  Intervals:none  ST&T Change: No ST segment elevation or depression. No abnormal T-wave inversion.  ____________________________________________  [id number]  DG Chest 2 View (Final result)  Result time 06/26/16 23:44:14  Final result by Jeannine Boga, MD (06/26/16 23:44:14)           Narrative:   CLINICAL DATA: Initial evaluation for acute cough.  EXAM: CHEST 2 VIEW  COMPARISON: Prior radiograph from 10/17/2015.  FINDINGS: Mild cardiomegaly, stable. Mediastinal silhouette within normal limits. Aortic atherosclerosis noted.  Lungs are normally inflated. There is focal hazy infiltrate overlying the mid right lung, which appears to localize to the superior right lower lobe on lateral projection. Finding concerning for pneumonia. Superimposed mild scattered peribronchial thickening. Left lung is grossly clear. No pulmonary edema or pleural effusion. No pneumothorax.  No acute osseous abnormality. Scattered degenerative spurring seen throughout the thoracic spine.  IMPRESSION: 1. Focal right lower lobe infiltrate, concerning for pneumonia. 2. Stable mild cardiomegaly without pulmonary edema. 3. Aortic atherosclerosis.   Electronically Signed By: Jeannine Boga M.D. On: 06/26/2016 23:44          ____________________________________________   PROCEDURES  Procedure(s) performed:   Procedures  Critical Care performed:   ____________________________________________   INITIAL IMPRESSION / ASSESSMENT AND PLAN / ED COURSE  Pertinent labs & imaging results that were available during my care of the patient were reviewed by me and considered in my medical decision making (see chart for details).    Clinical Course    ----------------------------------------- 2:13 AM on 06/27/2016 -----------------------------------------  Patient no longer with  any nausea or vomiting but is now desaturating to 88% on room air. Placed on 2 L nasal cannula oxygen. Will admit to the hospital for hypoxia with pneumonia. Feel that her headache is likely consistent with sinusitis. She says that the headache worsens when she bends over. She has a runny nose as well as a cough and there is tenderness to palpation over the maxillary sinuses. Likely viral infection with super imposed pneumonia. Explained to her the need for revision of the  hospital. Signed out to Dr. Marcille Blanco. The patient as well as the husband understanding of the plan and willing to comply. Much less likely to be giant cell arteritis. In the context of the patient's other symptoms the upper respiratory infection makes much more sense. Also the patient has had about 4 days of symptoms without any vision change.   ____________________________________________   FINAL CLINICAL IMPRESSION(S) / ED DIAGNOSES  Hypoxia. Community-acquired pneumonia. Nausea and vomiting. Sinusitis.    NEW MEDICATIONS STARTED DURING THIS VISIT:  New Prescriptions   No medications on file     Note:  This document was prepared using Dragon voice recognition software and may include unintentional dictation errors.    Orbie Pyo, MD 06/27/16 [phone]

## 2016-06-27 NOTE — Plan of Care (Signed)
Problem: Food- and Nutrition-Related Knowledge Deficit (NB-1.1) Goal: Nutrition education Formal process to instruct or train a patient/client in a skill or to impart knowledge to help patients/clients voluntarily manage or modify food choices and eating behavior to maintain or improve health. Outcome: Completed/Met Date Met: 06/27/16  RD consulted for nutrition education regarding diabetes.   Lab Results  Component Value Date   HGBA1C 8.1 (H) 09/29/2011   Spoke with patient and husband at bedside. Patient reports she was diagnosed with diabetes in 1988 and she spoke with a Dietitian many years ago regarding diabetes. However, she reports she does not follow any specific diet recommendations or count carbohydrates. She reports checking blood sugar 1-3 times per day (reports her MD asked her to check TID) - typically is around 150 in the morning and 300 in the evening. Patient reports taking Metformin, Lantus, and Novolog at home. Encouraged patient to pursue outpatient nutrition counseling to facilitate behavior change. Patient amenable to looking into this.  Typical Intake: Breakfast - grits or instant oatmeal or toast/bagel with peanut butter or cream cheese Lunch - soup with sandwich or just soup Dinner - sloppy joes, spaghetti, chili, or sandwiches Occasionally snacks - chips or apples/oranges Drinks - water or water with sugar-free flavor packs, occasionally diet soda  Patient's Goals (Self-Chosen): 1. Use carbohydrate counting to monitor current intake of carbohydrates. Encouraged her to compare current intake to recommendations of 3-5 choices/meal and 1-2 choices/snack. 2. Choose whole-grain carbohydrate choices.  RD provided "Carbohydrate Counting for People with Diabetes" handout from the Academy of Nutrition and Dietetics. Discussed different food groups and their effects on blood sugar, emphasizing carbohydrate-containing foods. Provided list of carbohydrates and recommended  serving sizes of common foods.  Discussed importance of controlled and consistent carbohydrate intake throughout the day. Provided examples of ways to balance meals/snacks and encouraged intake of high-fiber, whole grain complex carbohydrates. Teach back method used.  Expect fair compliance.  Body mass index is 41.8 kg/m. Pt meets criteria for Obesity Class III based on current BMI.  Current diet order is Heart Health/Carbohydrate Modified, patient is consuming approximately 100% of meals at this time. Labs and medications reviewed. No further nutrition interventions warranted at this time. RD contact information provided. If additional nutrition issues arise, please re-consult RD.  Willey Blade, MS, RD, LDN Pager: [phone] After Hours Pager: [phone]

## 2016-06-27 NOTE — H&P (Signed)
Diana Roberts is an 65 y.o. female.   Chief Complaint: Nausea and vomiting HPI: The patient with past medical history of diabetes presents to the emergency department complaining of nausea and vomiting. She has been complaining of these symptoms as well as ear pain for the last 5 days. During that time she has developed a productive cough and feels somewhat short of breath. In the emergency department she was found to be hypoxic down to 88% on room air which improved with supplemental oxygen via nasal cannula. Chest x-ray revealed right lower lobe infiltrate consistent with pneumonia which prompted the emergency department staff to start ceftriaxone and azithromycin prior to calling the hospitalist service for further management.  Past Medical History:  Diagnosis Date  . Anginal pain ([place])   . Anxiety   . Arthritis   . Depression   . Diabetes mellitus without complication ([place])   . Headache    migraines  . History of hiatal hernia   . Hypercholesteremia   . Hypertension   . Seasonal allergies   . Sleep apnea    Report - Care everywhere - 06/09/2014    Past Surgical History:  Procedure Laterality Date  . CARPAL TUNNEL RELEASE    . CATARACT EXTRACTION W/PHACO Left 02/08/2015   Procedure: CATARACT EXTRACTION PHACO AND INTRAOCULAR LENS PLACEMENT (IOC);  Surgeon: Leandrew Koyanagi, MD;  Location: [facility];  Service: Ophthalmology;  Laterality: Left;  DIABETIC  . CESAREAN SECTION    . HERNIA REPAIR    . KNEE ARTHROSCOPY      Family History  Problem Relation Age of Onset  . Cancer Mother   . Aneurysm Father    Social History:  reports that she has never smoked. She has never used smokeless tobacco. She reports that she does not drink alcohol or use drugs.  Allergies:  Allergies  Allergen Reactions  . Ciprofloxacin Hives  . Tylenol Sinus Severe [Pseudoephed-Apap-Guaifenesin] Rash    Medications Prior to Admission  Medication Sig Dispense Refill  . albuterol  (PROVENTIL HFA;VENTOLIN HFA) 108 (90 BASE) MCG/ACT inhaler Inhale 2 puffs into the lungs every 6 (six) hours as needed for wheezing or shortness of breath.    Marland Kitchen aspirin EC 81 MG tablet Take 81 mg by mouth daily.    Marland Kitchen buPROPion (WELLBUTRIN XL) 150 MG 24 hr tablet Take 150 mg by mouth daily.    . carvedilol (COREG) 6.25 MG tablet Take 6.25 mg by mouth 2 (two) times daily with a meal.    . Cholecalciferol 2000 UNITS CAPS Take 2,000 Units by mouth daily.     . clonazePAM (KLONOPIN) 0.5 MG tablet Take 0.5 mg by mouth 2 (two) times daily.    . Ferrous Gluconate (IRON 27 PO) Take 1 tablet by mouth daily.    Marland Kitchen GARLIC PO Take 1 tablet by mouth at bedtime.     Marland Kitchen glimepiride (AMARYL) 2 MG tablet Take 2 mg by mouth daily.    . hydrochlorothiazide (HYDRODIURIL) 12.5 MG tablet Take 12.5 mg by mouth daily.    Marland Kitchen HYDROcodone-acetaminophen (NORCO/VICODIN) 5-325 MG tablet Take 1-2 tablets by mouth every 6 (six) hours as needed. 6 tablet 0  . insulin aspart (NOVOLOG) 100 UNIT/ML injection Inject 30 Units into the skin daily with supper.     . insulin glargine (LANTUS) 100 UNIT/ML injection Inject 70 Units into the skin at bedtime.    Marland Kitchen lisinopril (PRINIVIL,ZESTRIL) 40 MG tablet Take 20 mg by mouth daily.    . magnesium oxide (MAG-OX)  400 MG tablet Take 400 mg by mouth 2 (two) times daily.    . metFORMIN (GLUCOPHAGE) 1000 MG tablet Take 1,000 mg by mouth See admin instructions. 500 mg with breakfast, and 2000 mg with dinner    . Multiple Vitamin (MULTIVITAMIN) tablet Take 1 tablet by mouth daily.    Marland Kitchen omega-3 acid ethyl esters (LOVAZA) 1 G capsule Take 1 g by mouth at bedtime.    . ondansetron (ZOFRAN ODT) 8 MG disintegrating tablet Take 1 tablet (8 mg total) by mouth every 8 (eight) hours as needed for nausea or vomiting. 6 tablet 0  . oxybutynin (DITROPAN-XL) 5 MG 24 hr tablet Take 5 mg by mouth at bedtime.    . simvastatin (ZOCOR) 40 MG tablet Take 40 mg by mouth at bedtime.       Results for orders placed or  performed during the hospital encounter of 06/26/16 (from the past 48 hour(s))  CBC with Differential     Status: Abnormal   Collection Time: 06/27/16 12:41 AM  Result Value Ref Range   WBC 15.1 (H) 3.6 - 11.0 K/uL   RBC 3.81 3.80 - 5.20 MIL/uL   Hemoglobin 11.8 (L) 12.0 - 16.0 g/dL   HCT 34.8 (L) 35.0 - 47.0 %   MCV 91.1 80.0 - 100.0 fL   MCH 30.8 26.0 - 34.0 pg   MCHC 33.8 32.0 - 36.0 g/dL   RDW 12.2 11.5 - 14.5 %   Platelets 312 150 - 440 K/uL   Neutrophils Relative % 82 %   Neutro Abs 12.2 (H) 1.4 - 6.5 K/uL   Lymphocytes Relative 13 %   Lymphs Abs 2.0 1.0 - 3.6 K/uL   Monocytes Relative 5 %   Monocytes Absolute 0.8 0.2 - 0.9 K/uL   Eosinophils Relative 0 %   Eosinophils Absolute 0.0 0 - 0.7 K/uL   Basophils Relative 0 %   Basophils Absolute 0.0 0 - 0.1 K/uL  Basic metabolic panel     Status: Abnormal   Collection Time: 06/27/16 12:41 AM  Result Value Ref Range   Sodium 133 (L) 135 - 145 mmol/L   Potassium 4.2 3.5 - 5.1 mmol/L   Chloride 96 (L) 101 - 111 mmol/L   CO2 24 22 - 32 mmol/L   Glucose, Bld 392 (H) 65 - 99 mg/dL   BUN 22 (H) 6 - 20 mg/dL   Creatinine, Ser 0.98 0.44 - 1.00 mg/dL   Calcium 8.9 8.9 - 10.3 mg/dL   GFR calc non Af Amer >60 >60 mL/min   GFR calc Af Amer >60 >60 mL/min    Comment: (NOTE) The eGFR has been calculated using the CKD EPI equation. This calculation has not been validated in all clinical situations. eGFR's persistently <60 mL/min signify possible Chronic Kidney Disease.    Anion gap 13 5 - 15  TSH     Status: None   Collection Time: 06/27/16 12:41 AM  Result Value Ref Range   TSH 1.225 0.350 - 4.500 uIU/mL    Comment: Performed by a 3rd Generation assay with a functional sensitivity of <=0.01 uIU/mL.   Dg Chest 2 View  Result Date: 06/26/2016 CLINICAL DATA:  Initial evaluation for acute cough. EXAM: CHEST  2 VIEW COMPARISON:  Prior radiograph from 10/17/2015. FINDINGS: Mild cardiomegaly, stable. Mediastinal silhouette within normal  limits. Aortic atherosclerosis noted. Lungs are normally inflated. There is focal hazy infiltrate overlying the mid right lung, which appears to localize to the superior right lower lobe on lateral  projection. Finding concerning for pneumonia. Superimposed mild scattered peribronchial thickening. Left lung is grossly clear. No pulmonary edema or pleural effusion. No pneumothorax. No acute osseous abnormality. Scattered degenerative spurring seen throughout the thoracic spine. IMPRESSION: 1. Focal right lower lobe infiltrate, concerning for pneumonia. 2. Stable mild cardiomegaly without pulmonary edema. 3. Aortic atherosclerosis. Electronically Signed   By: Jeannine Boga M.D.   On: 06/26/2016 23:44    Review of Systems  Constitutional: Negative for chills and fever.  HENT: Negative for sore throat and tinnitus.   Eyes: Negative for blurred vision and redness.  Respiratory: Positive for cough and shortness of breath.   Cardiovascular: Negative for chest pain, palpitations, orthopnea and PND.  Gastrointestinal: Negative for abdominal pain, diarrhea, nausea and vomiting.  Genitourinary: Negative for dysuria, frequency and urgency.  Musculoskeletal: Negative for joint pain and myalgias.  Skin: Negative for rash.       No lesions  Neurological: Negative for speech change, focal weakness and weakness.  Endo/Heme/Allergies: Does not bruise/bleed easily.       No temperature intolerance  Psychiatric/Behavioral: Negative for depression and suicidal ideas.    Blood pressure (!) 156/65, pulse 73, temperature 98.1 F (36.7 C), temperature source Oral, resp. rate 18, height '4\' 10"'  (1.473 m), weight 90.7 kg (200 lb), SpO2 98 %. Physical Exam  Vitals reviewed. Constitutional: She is oriented to person, place, and time. She appears well-developed and well-nourished. No distress.  HENT:  Head: Normocephalic and atraumatic.  Mouth/Throat: Oropharynx is clear and moist.  Eyes: Conjunctivae and EOM are  normal. Pupils are equal, round, and reactive to light. No scleral icterus.  Neck: Normal range of motion. No tracheal deviation present. No thyromegaly present.  Cardiovascular: Normal rate, regular rhythm and normal heart sounds.  Exam reveals no gallop and no friction rub.   No murmur heard. Respiratory: Effort normal. She has rhonchi in the left lower field.  GI: Soft. Bowel sounds are normal. She exhibits no distension. There is no tenderness.  Genitourinary:  Genitourinary Comments: Deferred  Musculoskeletal: Normal range of motion. She exhibits no edema.  Lymphadenopathy:    She has no cervical adenopathy.  Neurological: She is alert and oriented to person, place, and time. No cranial nerve deficit. She exhibits normal muscle tone.  Skin: Skin is warm and dry. No rash noted. No erythema.  Psychiatric: She has a normal mood and affect. Her behavior is normal. Judgment and thought content normal.     Assessment/Plan This is a 65 year old female admitted for pneumonia. 1. Community-acquired pneumonia: Patient does not meet criteria for sepsis. She is received IV antibiotics in the emergency department which we may transition to oral antibiotics. Continue supplemental oxygen as needed. Wean as tolerated. 2. Diabetes mellitus type 2: Continue basal insulin as well as sliding scale. Hold oral hypoglycemic agents. 3. CHF: Systolic; stable. Continue carvedilol. 4. Hypertension: Continue hydrocodone thiazide and lisinopril 5. Hyperlipidemia: Continue statin therapy 6. Urge incontinence: Continue oxybutynin 7. DVT prophylaxis: Lovenox 8. GI prophylaxis: None The patient is a full code. Time spent on admission orders and patient care approximately 45 minutes  Harrie Foreman, MD 06/27/2016, 7:22 AM

## 2016-06-27 NOTE — ED Notes (Signed)
Pt's O2 sats noted to drop to 85-88% range while pt resting comfortably in ED stretcher; pt w/o any c/o SHOB. O2 sats improved to 95-98% when instructed on deep breathing exercises, but dropped again when pt began breathing normally. Pt placed on 2L O2 via [place] at this time to maintain O2 sats above 92%; Dr Clearnce Hasten made aware.

## 2016-06-27 NOTE — Progress Notes (Signed)
Inpatient Diabetes Program Recommendations  AACE/ADA: New Consensus Statement on Inpatient Glycemic Control (2015)  Target Ranges:  Prepandial:   less than 140 mg/dL      Peak postprandial:   less than 180 mg/dL (1-2 hours)      Critically ill patients:  140 - 180 mg/dL   Results for EUVA, MEO (MRN [id number]) as of 06/27/2016 09:07  Ref. Range 06/27/2016 07:55  Glucose-Capillary Latest Ref Range: 65 - 99 mg/dL 328 (H)   A1C in process, will follow.  Review of Glycemic Control  Diabetes history: DM Outpatient Diabetes medications: Metformin 500 mg in am and 2000 mg in pm, Amaryl 2 mg, Novolog 30 units with supper, Lantus 70 units QHS   Note: Spoke with patient in room to verify home medications and states has taken these medications consistently. Patient states she takes CBG's at least BID and up to [facility] & HS.  Please consider changing Novolog 30 unit dose into Novolog 10 units TIDAC, which patient states may help with glycemic control when discharged.   Current orders for Inpatient glycemic control: Novolog 0-20 units resistant correction TIDAC, Lantus 50 units QHS  Inpatient Diabetes Program Recommendations:  Please consider decreasing Lantus dose from 50 units to 35 units QHS (half of home dose).    Consider decreasing to Novolog sensitive correction SSI (unless patient needs steroids).  Consider adding Novolog 5 units meal coverage TIDAC if patient eats > 50% of meal.  Consider Consult to Outpatient Endocrinologist.  RD consult ordered.   Thank you,  Windy Carina, RN, BSN Diabetes Coordinator Inpatient Diabetes Program [phone] (Team Pager)

## 2016-06-27 NOTE — Progress Notes (Signed)
[facility] [place] NAME: Diana Roberts    MR#:  [id number]  DATE OF BIRTH:  [birth year]  SUBJECTIVE:   Complaining of constipation Last bowel movement a week ago Poor by mouth intake for 1 week  REVIEW OF SYSTEMS:    Review of Systems  Constitutional: Negative.  Negative for chills, fever and malaise/fatigue.  HENT: Negative.  Negative for ear discharge, ear pain, hearing loss, nosebleeds and sore throat.   Eyes: Negative.  Negative for blurred vision and pain.  Respiratory: Positive for shortness of breath. Negative for cough, hemoptysis and wheezing.   Cardiovascular: Negative.  Negative for chest pain, palpitations and leg swelling.  Gastrointestinal: Positive for constipation. Negative for abdominal pain, blood in stool, diarrhea, nausea and vomiting.  Genitourinary: Negative.  Negative for dysuria.  Musculoskeletal: Negative.  Negative for back pain.  Skin: Negative.   Neurological: Negative for dizziness, tremors, speech change, focal weakness, seizures and headaches.  Endo/Heme/Allergies: Negative.  Does not bruise/bleed easily.  Psychiatric/Behavioral: Negative.  Negative for depression, hallucinations and suicidal ideas.    Tolerating Diet: Somewhat      DRUG ALLERGIES:   Allergies  Allergen Reactions  . Ciprofloxacin Hives  . Tylenol Sinus Severe [Pseudoephed-Apap-Guaifenesin] Rash    VITALS:  Blood pressure 140/68, pulse 72, temperature 98.5 F (36.9 C), temperature source Oral, resp. rate 18, height 4\' 10"  (1.473 m), weight 90.7 kg (200 lb), SpO2 96 %.  PHYSICAL EXAMINATION:   Physical Exam  Constitutional: She is oriented to person, place, and time and well-developed, well-nourished, and in no distress. No distress.  HENT:  Head: Normocephalic.  Eyes: No scleral icterus.  Neck: Normal range of motion. Neck supple. No JVD present. No tracheal deviation present.  Cardiovascular: Normal rate, regular rhythm  and normal heart sounds.  Exam reveals no gallop and no friction rub.   No murmur heard. Pulmonary/Chest: Effort normal. No respiratory distress. She has no wheezes. She has no rales. She exhibits no tenderness.  Right lower lung crackles  Abdominal: Soft. Bowel sounds are normal. She exhibits no distension and no mass. There is no tenderness. There is no rebound and no guarding.  Musculoskeletal: Normal range of motion. She exhibits no edema.  Neurological: She is alert and oriented to person, place, and time.  Skin: Skin is warm. No rash noted. No erythema.  Psychiatric: Affect and judgment normal.      LABORATORY PANEL:   CBC  Recent Labs Lab 06/27/16 0041  WBC 15.1*  HGB 11.8*  HCT 34.8*  PLT 312   ------------------------------------------------------------------------------------------------------------------  Chemistries   Recent Labs Lab 06/27/16 0041  NA 133*  K 4.2  CL 96*  CO2 24  GLUCOSE 392*  BUN 22*  CREATININE 0.98  CALCIUM 8.9   ------------------------------------------------------------------------------------------------------------------  Cardiac Enzymes No results for input(s): TROPONINI in the last 168 hours. ------------------------------------------------------------------------------------------------------------------  RADIOLOGY:  Dg Chest 2 View  Result Date: 06/26/2016 CLINICAL DATA:  Initial evaluation for acute cough. EXAM: CHEST  2 VIEW COMPARISON:  Prior radiograph from 10/17/2015. FINDINGS: Mild cardiomegaly, stable. Mediastinal silhouette within normal limits. Aortic atherosclerosis noted. Lungs are normally inflated. There is focal hazy infiltrate overlying the mid right lung, which appears to localize to the superior right lower lobe on lateral projection. Finding concerning for pneumonia. Superimposed mild scattered peribronchial thickening. Left lung is grossly clear. No pulmonary edema or pleural effusion. No pneumothorax. No  acute osseous abnormality. Scattered degenerative spurring seen throughout the thoracic spine. IMPRESSION: 1. Focal  right lower lobe infiltrate, concerning for pneumonia. 2. Stable mild cardiomegaly without pulmonary edema. 3. Aortic atherosclerosis. Electronically Signed   By: Jeannine Boga M.D.   On: 06/26/2016 23:44     ASSESSMENT AND PLAN:   65 year old female who presents this morning with shortness of breath on of community-acquired pneumonia  1. Acute hypoxic respiratory failure due to community-acquired pneumonia Wean oxygen as tolerated Continue azithromycin and amoxicillin  2. Constipation: Add suppository  3. Essential hypertension: Continue HCTZ and Coreg, lisinopril  4. Anxiety/depression: Continue Klonopin and Wellbutrin 5. Chronic systolic heart failure: Continue Coreg.  6. Hyperlipidemia: Continue Zocor  7. Diabetes: Continue Lantus with sliding scale insulin ADA diet  Management plans discussed with the patient and she is in agreement.  CODE STATUS: full  TOTAL TIME TAKING CARE OF THIS PATIENT: 30 minutes.     POSSIBLE D/C tomorrow, DEPENDING ON CLINICAL CONDITION.   Diana Roberts M.D on 06/27/2016 at 10:54 AM  Between 7am to 6pm - Pager - [phone] After 6pm go to [email or web address] - password [email or web address] [facility]  Office  [phone]  CC: Primary care physician; Diana Russell, MD  Note: This dictation was prepared with Dragon dictation along with smaller phrase technology. Any transcriptional errors that result from this process are unintentional.

## 2016-06-28 LAB — BASIC METABOLIC PANEL
ANION GAP: 10 (ref 5–15)
BUN: 20 mg/dL (ref 6–20)
CHLORIDE: 93 mmol/L — AB (ref 101–111)
CO2: 27 mmol/L (ref 22–32)
Calcium: 8.8 mg/dL — ABNORMAL LOW (ref 8.9–10.3)
Creatinine, Ser: 0.92 mg/dL (ref 0.44–1.00)
GFR calc Af Amer: >60 mL/min (ref >60)
Glucose, Bld: 384 mg/dL — ABNORMAL HIGH (ref 65–99)
POTASSIUM: 3.9 mmol/L (ref 3.5–5.1)
SODIUM: 130 mmol/L — AB (ref 135–145)

## 2016-06-28 LAB — CBC
HCT: 34.8 % — ABNORMAL LOW (ref 35.0–47.0)
HEMOGLOBIN: 12 g/dL (ref 12.0–16.0)
MCH: 31.1 pg (ref 26.0–34.0)
MCHC: 34.4 g/dL (ref 32.0–36.0)
MCV: 90.3 fL (ref 80.0–100.0)
PLATELETS: 325 10*3/uL (ref 150–440)
RBC: 3.85 MIL/uL (ref 3.80–5.20)
RDW: 12.5 % (ref 11.5–14.5)
WBC: 12.6 10*3/uL — AB (ref 3.6–11.0)

## 2016-06-28 LAB — GLUCOSE, CAPILLARY
Glucose-Capillary: 226 mg/dL — ABNORMAL HIGH (ref 65–99)
Glucose-Capillary: 333 mg/dL — ABNORMAL HIGH (ref 65–99)

## 2016-06-28 LAB — HEMOGLOBIN A1C
Hgb A1c MFr Bld: 7.6 % — ABNORMAL HIGH (ref 4.8–5.6)
Mean Plasma Glucose: 171 mg/dL

## 2016-06-28 MED ORDER — INSULIN ASPART 100 UNIT/ML ~~LOC~~ SOLN: 10.0000 [IU] | Freq: Three times a day (TID) | SUBCUTANEOUS | 11 refills | Status: AC

## 2016-06-28 MED ORDER — BENZONATATE 100 MG PO CAPS
200.0000 mg | ORAL_CAPSULE | Freq: Three times a day (TID) | ORAL | Status: DC
  Administered 2016-06-28: 200 mg via ORAL
  Filled 2016-06-28: qty 2

## 2016-06-28 MED ORDER — HYDROCOD POLST-CPM POLST ER 10-8 MG/5ML PO SUER
5.0000 mL | Freq: Two times a day (BID) | ORAL | Status: DC
  Administered 2016-06-28: 5 mL via ORAL
  Filled 2016-06-28: qty 5

## 2016-06-28 MED ORDER — BENZONATATE 200 MG PO CAPS: 200.0000 mg | ORAL_CAPSULE | Freq: Three times a day (TID) | ORAL | 0 refills | Status: DC

## 2016-06-28 MED ORDER — AMOXICILLIN-POT CLAVULANATE 875-125 MG PO TABS
1.0000 | ORAL_TABLET | Freq: Two times a day (BID) | ORAL | 0 refills | Status: DC
Start: 2016-06-28 — End: 2016-10-07

## 2016-06-28 NOTE — Progress Notes (Signed)
Inpatient Diabetes Program Recommendations  AACE/ADA: New Consensus Statement on Inpatient Glycemic Control (2015)  Target Ranges:  Prepandial:   less than 140 mg/dL      Peak postprandial:   less than 180 mg/dL (1-2 hours)      Critically ill patients:  140 - 180 mg/dL   Lab Results  Component Value Date   GLUCAP 226 (H) 06/28/2016   HGBA1C 7.6 (H) 06/27/2016  Results for MINEOLA, NEIGER (MRN [id number]) as of 06/28/2016 10:44  Ref. Range 06/27/2016 07:55 06/27/2016 11:58 06/27/2016 16:55 06/27/2016 21:26 06/28/2016 07:51  Glucose-Capillary Latest Ref Range: 65 - 99 mg/dL 328 (H) 253 (H) 233 (H) 316 (H) 226 (H)    Review of Glycemic Control  Diabetes history: DM Outpatient Diabetes medications: Metformin 500 mg in am and 2000 mg in pm, Amaryl 2 mg, Novolog 30 units with supper, Lantus 70 units QHS   Note: Spoke with patient yesterday in room to verify home medications and states has taken these medications consistently. Patient states she takes CBG's at least BID and up to [facility] & HS.  Please consider changing Novolog 30 unit dose into Novolog 10 units TIDAC, which patient states may help with glycemic control when discharged.   Current orders for Inpatient glycemic control: Novolog 0-20 units resistant correction TIDAC, Lantus 70 units QHS (increased dose)  Inpatient Diabetes Program Recommendations:             Consider decreasing to Novolog moderate correction SSI.             Consider adding Novolog 5 units meal coverage TIDAC if patient eats > 50% of meal.             Consider Consult to Outpatient Endocrinologist (patient requested).  Thank you,  Windy Carina, RN, BSN Diabetes Coordinator Inpatient Diabetes Program [phone] (Team Pager)

## 2016-06-28 NOTE — Discharge Instructions (Signed)
Community-Acquired Pneumonia, Adult °Pneumonia is an infection of the lungs. There are different types of pneumonia. One type can develop while a person is in a hospital. A different type, called community-acquired pneumonia, develops in people who are not, or have not recently been, in the hospital or other health care facility. °What are the causes? °Pneumonia may be caused by bacteria, viruses, or funguses. Community-acquired pneumonia is often caused by Streptococcus pneumonia bacteria. These bacteria are often passed from one person to another by breathing in droplets from the cough or sneeze of an infected person. °What increases the risk? °The condition is more likely to develop in: °· People who have chronic diseases, such as chronic obstructive pulmonary disease (COPD), asthma, congestive heart failure, cystic fibrosis, diabetes, or kidney disease. °· People who have early-stage or late-stage HIV. °· People who have sickle cell disease. °· People who have had their spleen removed (splenectomy). °· People who have poor dental hygiene. °· People who have medical conditions that increase the risk of breathing in (aspirating) secretions their own mouth and nose. °· People who have a weakened immune system (immunocompromised). °· People who smoke. °· People who travel to areas where pneumonia-causing germs commonly exist. °· People who are around animal habitats or animals that have pneumonia-causing germs, including birds, bats, rabbits, cats, and farm animals. ° °What are the signs or symptoms? °Symptoms of this condition include: °· A dry cough. °· A wet (productive) cough. °· Fever. °· Sweating. °· Chest pain, especially when breathing deeply or coughing. °· Rapid breathing or difficulty breathing. °· Shortness of breath. °· Shaking chills. °· Fatigue. °· Muscle aches. ° °How is this diagnosed? °Your health care provider will take a medical history and perform a physical exam. You may also have other tests,  including: °· Imaging studies of your chest, including X-rays. °· Tests to check your blood oxygen level and other blood gases. °· Other tests on blood, mucus (sputum), fluid around your lungs (pleural fluid), and urine. ° °If your pneumonia is severe, other tests may be done to identify the specific cause of your illness. °How is this treated? °The type of treatment that you receive depends on many factors, such as the cause of your pneumonia, the medicines you take, and other medical conditions that you have. For most adults, treatment and recovery from pneumonia may occur at home. In some cases, treatment must happen in a hospital. Treatment may include: °· Antibiotic medicines, if the pneumonia was caused by bacteria. °· Antiviral medicines, if the pneumonia was caused by a virus. °· Medicines that are given by mouth or through an IV tube. °· Oxygen. °· Respiratory therapy. ° °Although rare, treating severe pneumonia may include: °· Mechanical ventilation. This is done if you are not breathing well on your own and you cannot maintain a safe blood oxygen level. °· Thoracentesis. This procedure removes fluid around one lung or both lungs to help you breathe better. ° °Follow these instructions at home: °· Take over-the-counter and prescription medicines only as told by your health care provider. °? Only take cough medicine if you are losing sleep. Understand that cough medicine can prevent your body’s natural ability to remove mucus from your lungs. °? If you were prescribed an antibiotic medicine, take it as told by your health care provider. Do not stop taking the antibiotic even if you start to feel better. °· Sleep in a semi-upright position at night. Try sleeping in a reclining chair, or place a few pillows under your head. °· Do not use tobacco products, including cigarettes, chewing   tobacco, and e-cigarettes. If you need help quitting, ask your health care provider. °· Drink enough water to keep your urine  clear or pale yellow. This will help to thin out mucus secretions in your lungs. °How is this prevented? °There are ways that you can decrease your risk of developing community-acquired pneumonia. Consider getting a pneumococcal vaccine if: °· You are older than 63 years of age. °· You are older than 63 years of age and are undergoing cancer treatment, have chronic lung disease, or have other medical conditions that affect your immune system. Ask your health care provider if this applies to you. ° °There are different types and schedules of pneumococcal vaccines. Ask your health care provider which vaccination option is best for you. °You may also prevent community-acquired pneumonia if you take these actions: °· Get an influenza vaccine every year. Ask your health care provider which type of influenza vaccine is best for you. °· Go to the dentist on a regular basis. °· Wash your hands often. Use hand sanitizer if soap and water are not available. ° °Contact a health care provider if: °· You have a fever. °· You are losing sleep because you cannot control your cough with cough medicine. °Get help right away if: °· You have worsening shortness of breath. °· You have increased chest pain. °· Your sickness becomes worse, especially if you are an older adult or have a weakened immune system. °· You cough up blood. °This information is not intended to replace advice given to you by your health care provider. Make sure you discuss any questions you have with your health care provider. °Document Released: 07/29/2005 Document Revised: 12/07/2015 Document Reviewed: 11/23/2014 °Elsevier Interactive Patient Education © 2017 Elsevier Inc. ° °

## 2016-06-29 NOTE — Discharge Summary (Signed)
[facility] [place] NAME: Diana Roberts    MR#:  [id number]  DATE OF BIRTH:  12-02-1950  DATE OF ADMISSION:  06/26/2016   ADMITTING PHYSICIAN: Harrie Foreman, MD  DATE OF DISCHARGE: 06/28/2016  3:11 PM  PRIMARY CARE PHYSICIAN: Ballard Russell, MD   ADMISSION DIAGNOSIS:   Hypoxia [R09.02] Acute maxillary sinusitis, recurrence not specified [J01.00] Nausea and vomiting, intractability of vomiting not specified, unspecified vomiting type [R11.2] Community acquired pneumonia of right lung, unspecified part of lung [J18.9]  DISCHARGE DIAGNOSIS:   Active Problems:   CAP (community acquired pneumonia)   SECONDARY DIAGNOSIS:   Past Medical History:  Diagnosis Date  . Anginal pain ([place])   . Anxiety   . Arthritis   . Depression   . Diabetes mellitus without complication ([place])   . Headache    migraines  . History of hiatal hernia   . Hypercholesteremia   . Hypertension   . Seasonal allergies   . Sleep apnea    Report - Care everywhere - 06/09/2014    HOSPITAL COURSE:   65 year old female with past medical history significant for diabetes mellitus, hypertension, depression and anxiety presents to hospital secondary to community-acquired pneumonia.  #1 acute hypoxic respiratory failure-secondary to community-acquired pneumonia- -Blood cultures are negative. Weaned off oxygen. Patient was on azithromycin and amoxicillin. Being discharged on Augmentin. -Cough medications ordered.  #2 diabetes mellitus-does not have a endocrinologist as outpatient. Continue glimepiride and metformin. -Also on Lantus and NovoLog prior to meals. -Endocrinology referral given at discharge. -A1c is 7.6  #3 hypertension-continue home medications. Patient on hydrochlorothiazide, lisinopril, carvedilol  #4 depression and anxiety-continue home medications.  Patient has been weaned off oxygen. Doing very well and wanting to go home,  she  is being discharged on oral antibiotics.   DISCHARGE CONDITIONS:   Guarded  CONSULTS OBTAINED:   None  DRUG ALLERGIES:   Allergies  Allergen Reactions  . Ciprofloxacin Hives  . Tylenol Sinus Severe [Pseudoephed-Apap-Guaifenesin] Rash   DISCHARGE MEDICATIONS:     Medication List    TAKE these medications   albuterol 108 (90 Base) MCG/ACT inhaler Commonly known as:  PROVENTIL HFA;VENTOLIN HFA Inhale 2 puffs into the lungs every 6 (six) hours as needed for wheezing or shortness of breath.   amoxicillin-clavulanate 875-125 MG tablet Commonly known as:  AUGMENTIN Take 1 tablet by mouth 2 (two) times daily. X 7 more days   aspirin EC 81 MG tablet Take 81 mg by mouth daily.   benzonatate 200 MG capsule Commonly known as:  TESSALON Take 1 capsule (200 mg total) by mouth 3 (three) times daily.   buPROPion 150 MG 24 hr tablet Commonly known as:  WELLBUTRIN XL Take 150 mg by mouth daily.   carvedilol 6.25 MG tablet Commonly known as:  COREG Take 6.25 mg by mouth 2 (two) times daily with a meal.   Cholecalciferol 2000 units Caps Take 2,000 Units by mouth daily.   clonazePAM 0.5 MG tablet Commonly known as:  KLONOPIN Take 0.5 mg by mouth 2 (two) times daily.   GARLIC PO Take 1 tablet by mouth at bedtime.   glimepiride 2 MG tablet Commonly known as:  AMARYL Take 2 mg by mouth daily.   hydrochlorothiazide 12.5 MG tablet Commonly known as:  HYDRODIURIL Take 12.5 mg by mouth daily.   HYDROcodone-acetaminophen 5-325 MG tablet Commonly known as:  NORCO/VICODIN Take 1-2 tablets by mouth every 6 (six) hours as needed.  insulin aspart 100 UNIT/ML injection Commonly known as:  novoLOG Inject 10 Units into the skin 3 (three) times daily with meals. What changed:  how much to take  when to take this   insulin glargine 100 UNIT/ML injection Commonly known as:  LANTUS Inject 70 Units into the skin at bedtime.   IRON 27 PO Take 1 tablet by mouth daily.     lisinopril 40 MG tablet Commonly known as:  PRINIVIL,ZESTRIL Take 20 mg by mouth daily.   magnesium oxide 400 MG tablet Commonly known as:  MAG-OX Take 400 mg by mouth 2 (two) times daily.   metFORMIN 1000 MG tablet Commonly known as:  GLUCOPHAGE Take 1,000 mg by mouth See admin instructions. 500 mg with breakfast, and 2000 mg with dinner   multivitamin tablet Take 1 tablet by mouth daily.   omega-3 acid ethyl esters 1 g capsule Commonly known as:  LOVAZA Take 1 g by mouth at bedtime.   ondansetron 8 MG disintegrating tablet Commonly known as:  ZOFRAN ODT Take 1 tablet (8 mg total) by mouth every 8 (eight) hours as needed for nausea or vomiting.   oxybutynin 5 MG 24 hr tablet Commonly known as:  DITROPAN-XL Take 5 mg by mouth at bedtime.   simvastatin 40 MG tablet Commonly known as:  ZOCOR Take 40 mg by mouth at bedtime.        DISCHARGE INSTRUCTIONS:   1. PCP f/u in 1-2 weeks  DIET:   Cardiac diet and diabetic carb control diet  ACTIVITY:   Activity as tolerated  OXYGEN:   Home Oxygen: No.  Oxygen Delivery: room air  DISCHARGE LOCATION:   home   If you experience worsening of your admission symptoms, develop shortness of breath, life threatening emergency, suicidal or homicidal thoughts you must seek medical attention immediately by calling 911 or calling your MD immediately  if symptoms less severe.  You Must read complete instructions/literature along with all the possible adverse reactions/side effects for all the Medicines you take and that have been prescribed to you. Take any new Medicines after you have completely understood and accpet all the possible adverse reactions/side effects.   Please note  You were cared for by a hospitalist during your hospital stay. If you have any questions about your discharge medications or the care you received while you were in the hospital after you are discharged, you can call the unit and asked to speak with  the hospitalist on call if the hospitalist that took care of you is not available. Once you are discharged, your primary care physician will handle any further medical issues. Please note that NO REFILLS for any discharge medications will be authorized once you are discharged, as it is imperative that you return to your primary care physician (or establish a relationship with a primary care physician if you do not have one) for your aftercare needs so that they can reassess your need for medications and monitor your lab values.    On the day of Discharge:  VITAL SIGNS:   Blood pressure (!) 124/57, pulse 62, temperature 98.3 F (36.8 C), temperature source Oral, resp. rate 14, height 4\' 10"  (1.473 m), weight 90.8 kg (200 lb 1.6 oz), SpO2 95 %.  PHYSICAL EXAMINATION:    GENERAL:  65 y.o.-year-old patient lying in the bed with no acute distress.  EYES: Pupils equal, round, reactive to light and accommodation. No scleral icterus. Extraocular muscles intact.  HEENT: Head atraumatic, normocephalic. Oropharynx and nasopharynx clear.  NECK:  Supple, no jugular venous distention. No thyroid enlargement, no tenderness.  LUNGS: Normal breath sounds bilaterally, no wheezing, rhonchi or crepitation. No use of accessory muscles of respiration. I basilar crackles and some rhonchi, worse on the right side heard. CARDIOVASCULAR: S1, S2 normal. No murmurs, rubs, or gallops.  ABDOMEN: Soft, non-tender, non-distended. Bowel sounds present. No organomegaly or mass.  EXTREMITIES: No pedal edema, cyanosis, or clubbing.  NEUROLOGIC: Cranial nerves II through XII are intact. Muscle strength 5/5 in all extremities. Sensation intact. Gait not checked.  PSYCHIATRIC: The patient is alert and oriented x 3.  SKIN: No obvious rash, lesion, or ulcer.   DATA REVIEW:   CBC  Recent Labs Lab 06/28/16 1012  WBC 12.6*  HGB 12.0  HCT 34.8*  PLT 325    Chemistries   Recent Labs Lab 06/28/16 1012  NA 130*  K 3.9    CL 93*  CO2 27  GLUCOSE 384*  BUN 20  CREATININE 0.92  CALCIUM 8.8*     Microbiology Results  Results for orders placed or performed during the hospital encounter of 10/17/15  Rapid Influenza A&B Antigens ([facility] only)     Status: None   Collection Time: 10/17/15  7:48 PM  Result Value Ref Range Status   Influenza A ([facility]) NEGATIVE NEGATIVE Final   Influenza B ([facility]) NEGATIVE NEGATIVE Final    RADIOLOGY:  No results found.   Management plans discussed with the patient, family and they are in agreement.  CODE STATUS:  Code Status History    Date Active Date Inactive Code Status Order ID Comments User Context   06/27/2016  3:24 AM 06/28/2016  6:16 PM Full Code [id number]  Harrie Foreman, MD Inpatient      TOTAL TIME TAKING CARE OF THIS PATIENT: 38 minutes.    Gladstone Lighter M.D on 06/29/2016 at 2:06 PM  Between 7am to 6pm - Pager - [phone]  After 6pm go to [email or web address] - Proofreader  [facility]  Office  [phone]  CC: Primary care physician; Ballard Russell, MD   Note: This dictation was prepared with Dragon dictation along with smaller phrase technology. Any transcriptional errors that result from this process are unintentional.

## 2016-07-11 ENCOUNTER — Other Ambulatory Visit: Payer: Self-pay | Admitting: Family Medicine

## 2016-07-11 DIAGNOSIS — Z1239 Encounter for other screening for malignant neoplasm of breast: Secondary | ICD-10-CM

## 2016-07-18 DIAGNOSIS — M654 Radial styloid tenosynovitis [de Quervain]: Secondary | ICD-10-CM | POA: Insufficient documentation

## 2016-07-30 ENCOUNTER — Ambulatory Visit
Admission: RE | Admit: 2016-07-30 | Discharge: 2016-07-30 | Disposition: A | Discharge status: Home / Self Care | Payer: BLUE CROSS/BLUE SHIELD | Source: Ambulatory Visit | Attending: Family Medicine | Admitting: Family Medicine

## 2016-07-30 DIAGNOSIS — Z1231 Encounter for screening mammogram for malignant neoplasm of breast: Secondary | ICD-10-CM | POA: Insufficient documentation

## 2016-07-30 DIAGNOSIS — Z1239 Encounter for other screening for malignant neoplasm of breast: Secondary | ICD-10-CM

## 2016-08-13 IMAGING — MR MR ANKLE*L* WO/W CM
8 of 9 series · 36 of 40 positions shown · IV contrast (20ml Multihance)
Comparison: None.

CLINICAL DATA: Left ankle pain and joint numbness.

EXAM:
MRI OF THE LEFT ANKLE WITHOUT AND WITH CONTRAST
TECHNIQUE: Multiplanar, multisequence MR imaging of the ankle was performed
before and after the administration of intravenous contrast.
CONTRAST:  20mL MULTIHANCE GADOBENATE DIMEGLUMINE 529 MG/ML IV SOLN

[Series 3: T1 · axial · 3.0mm · 0.56mm/px · z∈[-102,+57]mm · 6 of 45 slices shown (1 of 2)]
[im 1/45]
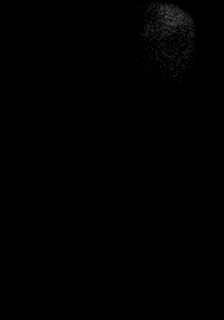
[im 9/45]
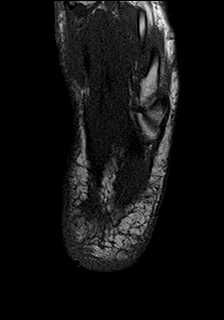
[im 18/45]
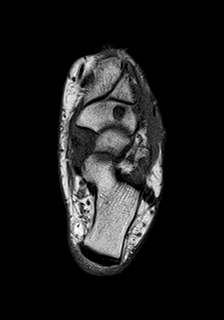
[im 27/45]
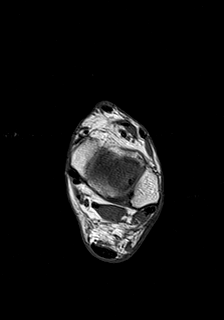
[im 36/45]
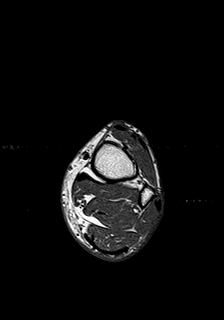
[im 45/45]
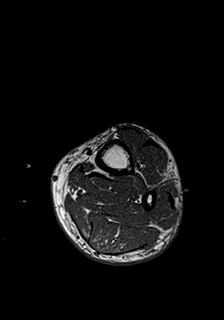

[Series 4: axial t1fs · axial · 3.0mm · 0.56mm/px · z∈[-102,+57]mm · 6 of 45 slices shown]
[im 1/45]
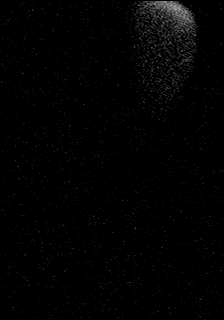
[im 9/45]
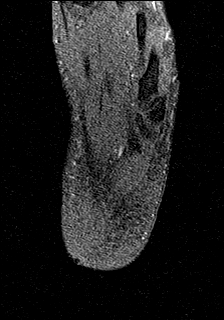
[im 18/45]
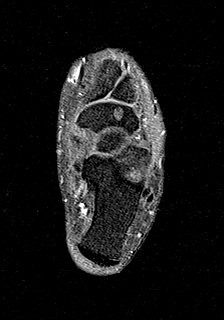
[im 27/45]
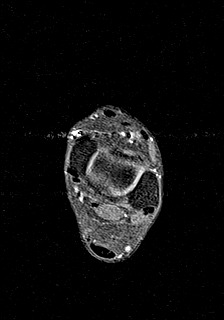
[im 36/45]
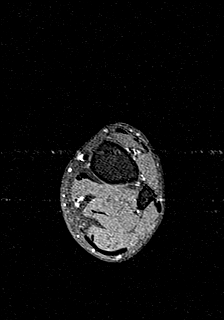
[im 45/45]
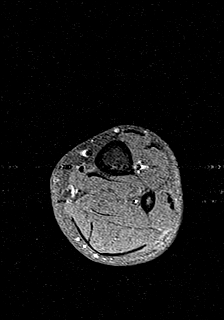

[Series 5: T2 fat-sat · axial · 3.0mm · 0.35mm/px · z∈[-102,+57]mm · 5 of 45 slices shown]
[im 1/45]
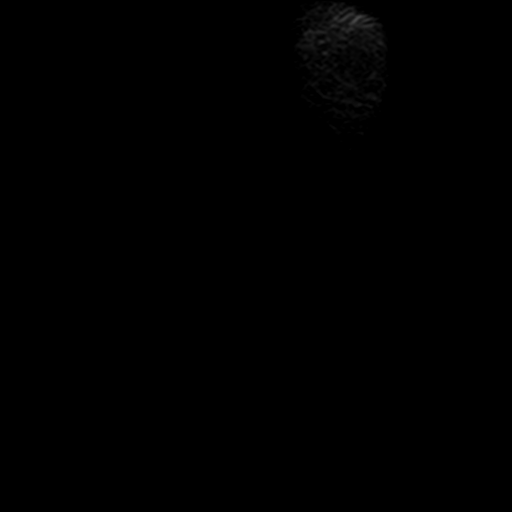
[im 12/45]
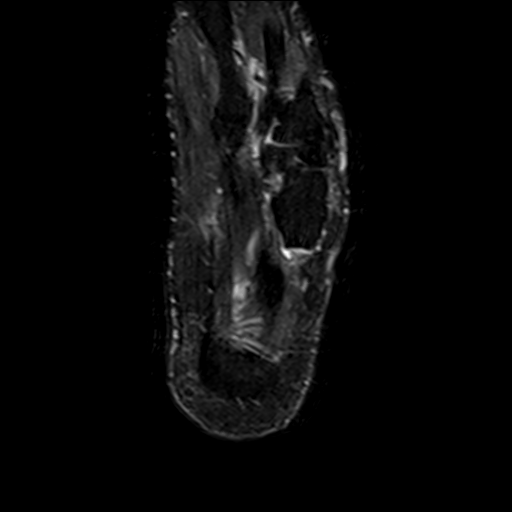
[im 23/45]
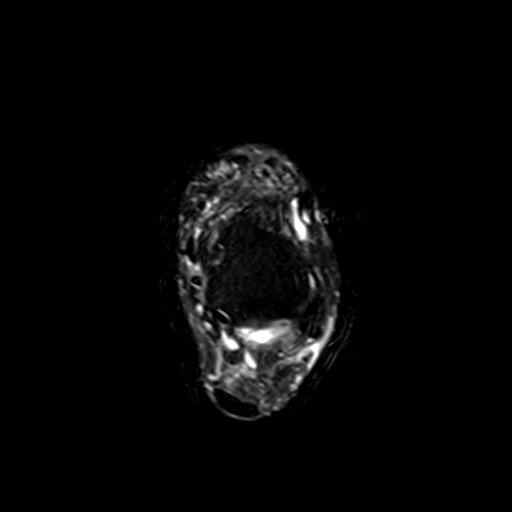
[im 34/45]
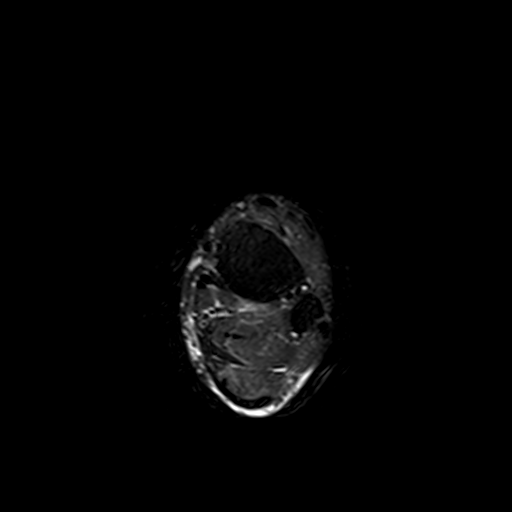
[im 45/45]
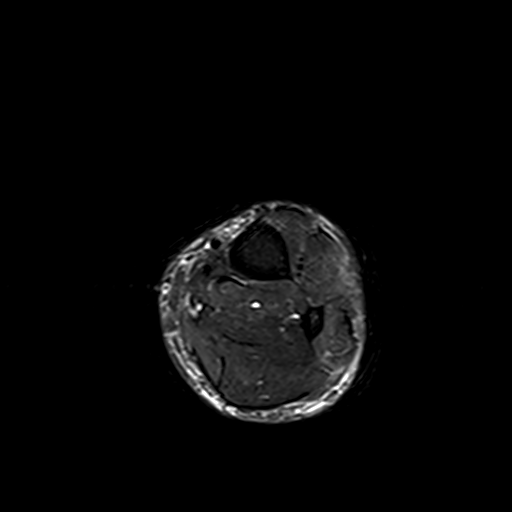

[Series 6: T1 · coronal · 3.0mm · 0.35mm/px · 4 of 39 slices shown (2 of 2)]
[im 1/39]
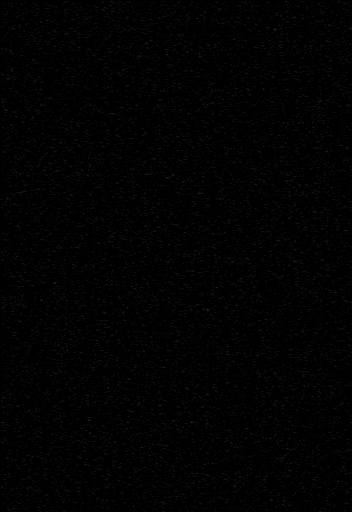
[im 13/39]
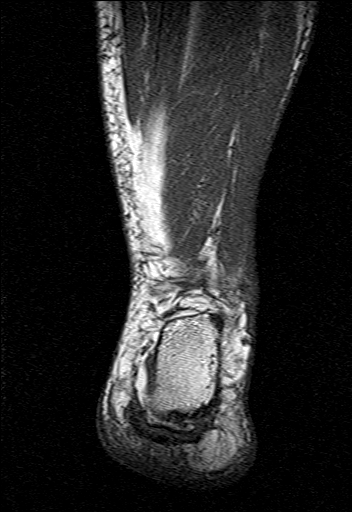
[im 26/39]
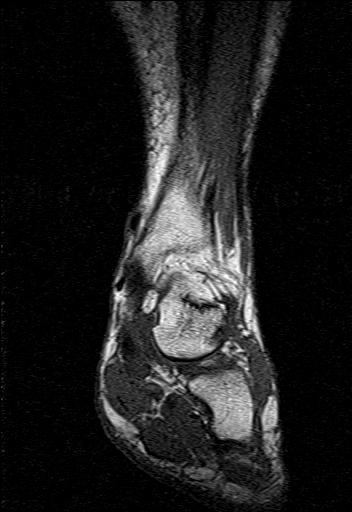
[im 39/39]
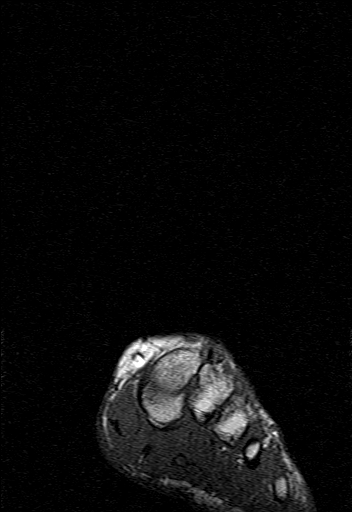

[Series 7: STIR · coronal · 3.0mm · 0.35mm/px · 3 of 39 slices shown]
[im 1/39]
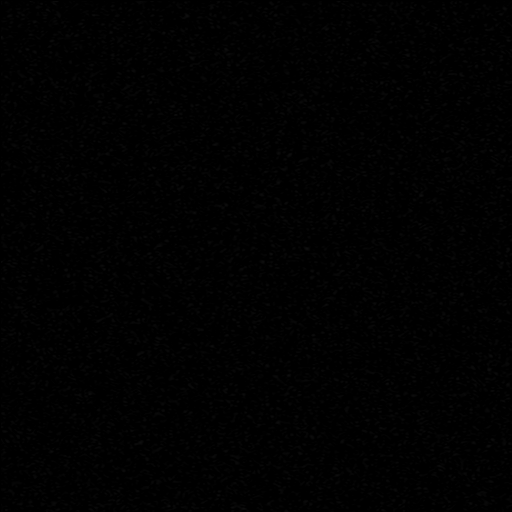
[im 13/39]
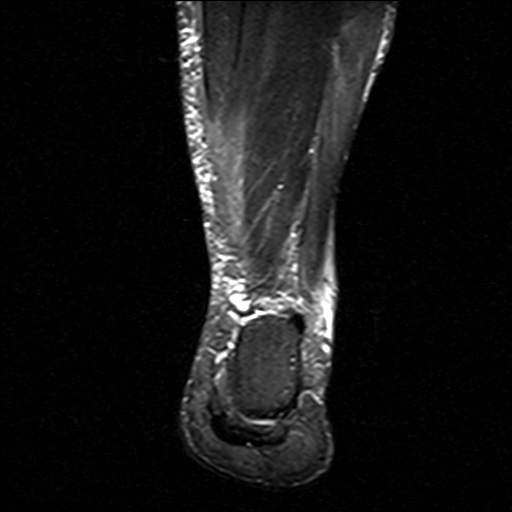
[im 26/39]
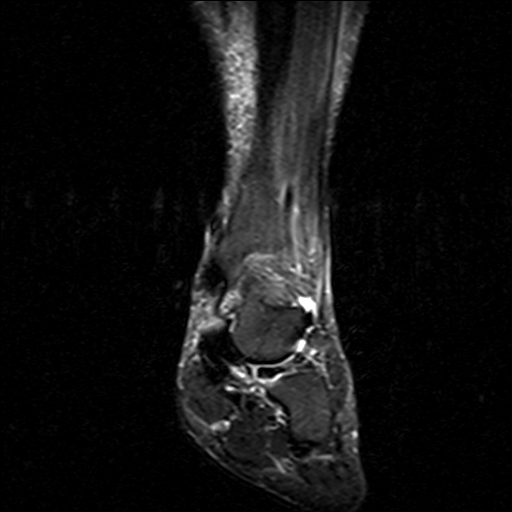

[Series 9: T1 fat-sat post-contrast · axial · 3.0mm · 0.56mm/px · z∈[-102,+57]mm · 5 of 45 slices shown (1 of 3)]
[im 1/45]
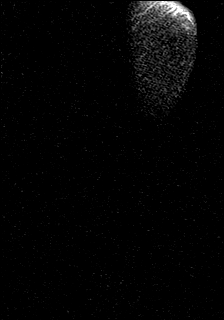
[im 12/45]
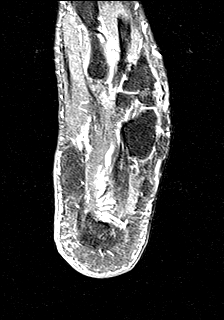
[im 23/45]
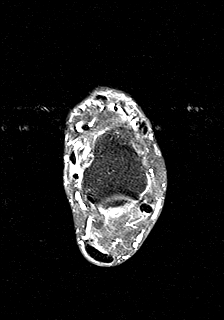
[im 34/45]
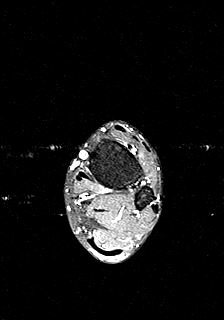
[im 45/45]
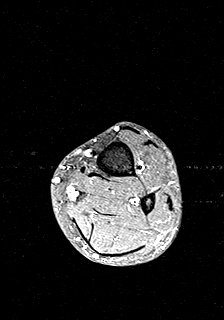

[Series 10: T1 fat-sat post-contrast · coronal · 3.0mm · 0.40mm/px · 4 of 39 slices shown (2 of 3)]
[im 1/39]
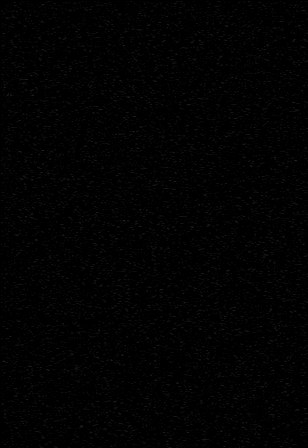
[im 13/39]
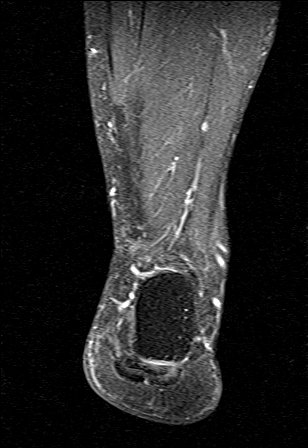
[im 26/39]
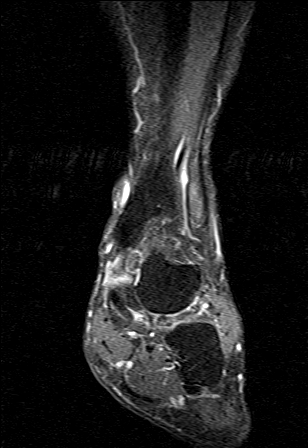
[im 39/39]
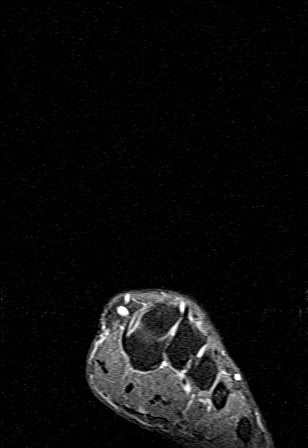

[Series 11: T1 fat-sat post-contrast · sagittal · 3.0mm · 0.70mm/px · 3 of 26 slices shown (3 of 3)]
[im 1/26]
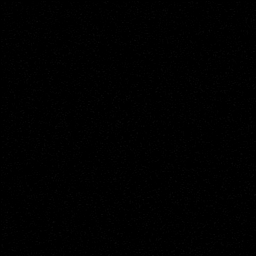
[im 13/26]
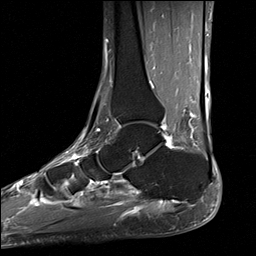
[im 26/26]
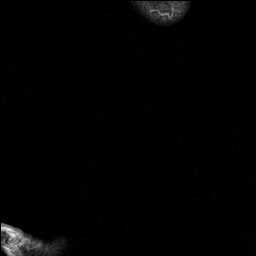

[36 of 40 positions shown; findings below may reference images not displayed]

FINDINGS: TENDONS

Peroneal: Intact.

Posteromedial: Intact.

Anterior: Intact.

Achilles: Intact.

Plantar Fascia: Intact.  Plantar calcaneal spur.

LIGAMENTS

Lateral: Attenuated anterior talofibular ligament consistent with
chronic injury. Intact posterior talofibular ligament. Intact
anterior and posterior tibiofibular ligaments.

Medial: Chronic injury of the deltoid ligament without a tear.
Attenuated spring ligament most consistent with injury.

CARTILAGE

Ankle Joint: Normal ankle mortise. No chondral defect. No joint
effusion.

Subtalar Joints/Sinus Tarsi: Normal sinus tarsi. Normal subtalar
joints. No joint effusion.

Bones: Mild degenerative changes between the middle and lateral
cuneiforms with subchondral reactive marrow changes in the lateral
cuneiform. No acute fracture or dislocation.

Soft tissue: No soft tissue mass, fluid collection or hematoma. No
areas of abnormal enhancement.
IMPRESSION: IMPRESSION
1. No soft tissue mass around the left ankle.
2. Chronic injuries of the anterior talofibular ligament, deltoid
ligament and spring ligament without complete disruption.

## 2016-10-07 ENCOUNTER — Encounter: Payer: BLUE CROSS/BLUE SHIELD | Attending: Internal Medicine | Admitting: Dietician

## 2016-10-07 ENCOUNTER — Encounter: Payer: Self-pay | Admitting: Dietician

## 2016-10-07 VITALS — BP 100/64 | Ht <= 58 in | Wt 192.9 lb

## 2016-10-07 DIAGNOSIS — E119 Type 2 diabetes mellitus without complications: Secondary | ICD-10-CM | POA: Insufficient documentation

## 2016-10-07 DIAGNOSIS — Z713 Dietary counseling and surveillance: Secondary | ICD-10-CM | POA: Diagnosis not present

## 2016-10-07 DIAGNOSIS — Z6841 Body Mass Index (BMI) 40.0 and over, adult: Secondary | ICD-10-CM | POA: Insufficient documentation

## 2016-10-07 DIAGNOSIS — Z794 Long term (current) use of insulin: Secondary | ICD-10-CM

## 2016-10-07 NOTE — Progress Notes (Signed)
Diabetes Self-Management Education  Visit Type: First/Initial  Appt. Start Time:1030 Appt. End Time: [id number]  10/07/2016  Ms. Diana Roberts, identified by name and date of birth, is a 66 y.o. female with a diagnosis of Diabetes: Type 2.   ASSESSMENT  Blood pressure 100/64, height 4\' 10"  (1.473 m), weight 192 lb 14.4 oz (87.5 kg). Body mass index is 40.32 kg/m.      Diabetes Self-Management Education - 10/07/16 1235      Visit Information   Visit Type First/Initial     Initial Visit   Diabetes Type Type 2     Health Coping   How would you rate your overall health? Fair     Psychosocial Assessment   Patient Belief/Attitude about Diabetes Motivated to manage diabetes   Self-care barriers None   Patient Concerns Medication;Monitoring;Healthy Lifestyle;Problem Solving;Glycemic Control;Weight Control;Nutrition/Meal planning   Special Needs None   Preferred Learning Style Auditory   Learning Readiness Ready   What is the last grade level you completed in school? 12     Pre-Education Assessment   Patient understands the diabetes disease and treatment process. Needs Review   Patient understands incorporating nutritional management into lifestyle. Needs Review   Patient undertands incorporating physical activity into lifestyle. Needs Review   Patient understands using medications safely. Needs Review   Patient understands monitoring blood glucose, interpreting and using results Needs Review   Patient understands prevention, detection, and treatment of acute complications. Needs Review   Patient understands prevention, detection, and treatment of chronic complications. Needs Review   Patient understands how to develop strategies to address psychosocial issues. Needs Review   Patient understands how to develop strategies to promote health/change behavior. Needs Review     Complications   Last HgB A1C per patient/outside source 6.4 %  09-13-2016   How often do you check your blood  sugar? 3-4 times/day   Fasting Blood glucose range (mg/dL) 130-179;180-200;>200   Postprandial Blood glucose range (mg/dL) 180-200;>200;130-179   Have you had a dilated eye exam in the past 12 months? Yes  09-2016   Have you had a dental exam in the past 12 months? No  about 5 years ago   Are you checking your feet? Yes   How many days per week are you checking your feet? 2     Dietary Intake   Breakfast --  breakfast time varies-7:30a-9a   Snack (morning) --  eats nabs, pretzels or fruit at 12p   Lunch --  skips lunch   Snack (afternoon) --  eats nabs or fruit at [employer] --  supper meal time varies-eats 4:30p-6p; eats small amounts and limits intake of sweets and fried foods   Snack (evening) --  none   Beverage(s) --  drinks water and unsweetened beverages 4-5x/day + low fat milk 1x/day     Exercise   Exercise Type --  walks 10-15 min. 1-2x/wk.-limited due to arthritis pain in both knees     Patient Education   Previous Diabetes Education Yes (please comment)  1990 at [place] [facility]  Explored patient's options for treatment of their diabetes;Definition of diabetes, type 1 and 2, and the diagnosis of diabetes   Nutrition management  Role of diet in the treatment of diabetes and the relationship between the three main macronutrients and blood glucose level;Food label reading, portion sizes and measuring food.;Carbohydrate counting   Physical activity and exercise  Role of exercise on diabetes management, blood pressure control and  cardiac health.;Helped patient identify appropriate exercises in relation to his/her diabetes, diabetes complications and other health issue.   Medications Taught/reviewed insulin injection, site rotation, insulin storage and needle disposal.;Reviewed patients medication for diabetes, action, purpose, timing of dose and side effects.;Reviewed medication adjustment guidelines for hyperglycemia and sick days.   Monitoring Purpose and  frequency of SMBG.;Yearly dilated eye exam;Identified appropriate SMBG and/or A1C goals.;Taught/discussed recording of test results and interpretation of SMBG.;Daily foot exams   Acute complications Taught treatment of hypoglycemia - the 15 rule.;Discussed and identified patients' treatment of hyperglycemia.;Covered sick day management with medication and food.   Chronic complications Relationship between chronic complications and blood glucose control;Dental care;Retinopathy and reason for yearly dilated eye exams;Nephropathy, what it is, prevention of, the use of ACE, ARB's and early detection of through urine microalbumia.;Reviewed with patient heart disease, higher risk of, and prevention   Psychosocial adjustment Role of stress on diabetes   Personal strategies to promote health Lifestyle issues that need to be addressed for better diabetes care;Helped patient develop diabetes management plan for (enter comment)     Outcomes   Expected Outcomes Demonstrated interest in learning. Expect positive outcomes      Individualized Plan for Diabetes Self-Management Training:   Learning Objective:  Patient will have a greater understanding of diabetes self-management. Patient education plan is to attend individual and/or group sessions per assessed needs and concerns.   Plan:   Patient Instructions   Check blood sugars 3 x day before breakfast and lunch and 2 hour after supper daily and record  Exercise: try exercises on DVD and in exercise booklet  Avoid sugar sweetened drinks (soda, tea, coffee, sports drinks, juices) Limit intake of sweets and fried foods Eat 2-3 carbohydrate servings/meal + protein Eat 1 carbohydrate serving/snack + protein Eat 3 meals day,   1 a day at bedtime Space meals 4-5 hours apart Complete 3 Day Food Record and bring to next appt Make a dentist appointment Bring blood sugar records to the next appointment/class Get a Sharps container Carry fast acting glucose  and a snack at all times Carry medical alert ID at all  times Rotate injection sites Return for appointment/classes on:  10-21-16   Expected Outcomes:  Demonstrated interest in learning. Expect positive outcomes  Education material provided: general meal planning guidelines, A1C handout, Living Well With Diabetes booklet, Exercise booklet and DVD, low BG handout  If problems or questions, patient to contact team via:  [phone]  Future DSME appointment:   10-21-16

## 2016-10-07 NOTE — Patient Instructions (Signed)
  Check blood sugars 3 x day before breakfast and lunch and 2 hour after supper daily and record  Exercise: try exercises on DVD and in exercise booklet  Avoid sugar sweetened drinks (soda, tea, coffee, sports drinks, juices) Limit intake of sweets and fried foods Eat 2-3 carbohydrate servings/meal + protein Eat 1 carbohydrate serving/snack + protein Eat 3 meals day,   1 a day at bedtime Space meals 4-5 hours apart Complete 3 Day Food Record and bring to next appt Make a dentist appointment Bring blood sugar records to the next appointment/class Get a Sharps container Carry fast acting glucose and a snack at all times Carry medical alert ID at all  times Rotate injection sites Return for appointment/classes on:  10-21-16

## 2016-10-21 ENCOUNTER — Encounter: Payer: BLUE CROSS/BLUE SHIELD | Attending: Internal Medicine | Admitting: Dietician

## 2016-10-21 ENCOUNTER — Encounter: Payer: Self-pay | Admitting: Dietician

## 2016-10-21 VITALS — BP 108/56 | Ht <= 58 in | Wt 188.7 lb

## 2016-10-21 DIAGNOSIS — E119 Type 2 diabetes mellitus without complications: Secondary | ICD-10-CM | POA: Diagnosis present

## 2016-10-21 DIAGNOSIS — Z794 Long term (current) use of insulin: Secondary | ICD-10-CM

## 2016-10-21 DIAGNOSIS — Z713 Dietary counseling and surveillance: Secondary | ICD-10-CM | POA: Insufficient documentation

## 2016-10-21 DIAGNOSIS — Z6841 Body Mass Index (BMI) 40.0 and over, adult: Secondary | ICD-10-CM | POA: Insufficient documentation

## 2016-10-21 NOTE — Patient Instructions (Signed)
   Use food lists and menus provided to plan nutritionally balanced meals.   Try including a small snack at bedtime if several hours after supper, see if it helps keep morning blood sugar in good control.   Keep up your good portion control and make healthy food choices, good job so far!  Keep up some walking or other exercise.

## 2016-10-21 NOTE — Progress Notes (Signed)
Diabetes Self-Management Education  Visit Type:  Follow-up  Appt. Start Time: 1000 Appt. End Time: 1100  10/21/2016  Ms. Cellie Dardis, identified by name and date of birth, is a 66 y.o. female with a diagnosis of Diabetes:  .   ASSESSMENT  Blood pressure (!) 108/56, height 4\' 10"  (1.473 m), weight 188 lb 11.2 oz (85.6 kg). Body mass index is 39.44 kg/m.       Diabetes Self-Management Education - 74/08/14 4818      Complications   How often do you check your blood sugar? 3-4 times/day   Fasting Blood glucose range (mg/dL) 130-179   Postprandial Blood glucose range (mg/dL) 130-179;180-200;>200   Number of hypoglycemic episodes per month 0   Have you had a dilated eye exam in the past 12 months? Yes   Have you had a dental exam in the past 12 months? No   Are you checking your feet? Yes   How many days per week are you checking your feet? 2  MD checked at last visit     Dietary Intake   Breakfast 2 meals and 2 snacks daily     Exercise   Exercise Type Light (walking / raking leaves)   How many days per week to you exercise? 2   How many minutes per day do you exercise? 20  including walking and yardwork   Total minutes per week of exercise 40     Patient Education   Nutrition management  Role of diet in the treatment of diabetes and the relationship between the three main macronutrients and blood glucose level;Food label reading, portion sizes and measuring food.;Meal timing in regards to the patients' current diabetes medication.;Meal options for control of blood glucose level and chronic complications.;Other (comment)  basic meal planning for 1200kcal   Physical activity and exercise  Role of exercise on diabetes management, blood pressure control and cardiac health.   Monitoring Taught/discussed recording of test results and interpretation of SMBG.   Psychosocial adjustment Role of stress on diabetes     Post-Education Assessment   Patient understands the diabetes  disease and treatment process. Demonstrates understanding / competency   Patient understands incorporating nutritional management into lifestyle. Demonstrates understanding / competency   Patient undertands incorporating physical activity into lifestyle. Demonstrates understanding / competency   Patient understands using medications safely. Demonstrates understanding / competency   Patient understands monitoring blood glucose, interpreting and using results Demonstrates understanding / competency   Patient understands prevention, detection, and treatment of acute complications. Demonstrates understanding / competency   Patient understands prevention, detection, and treatment of chronic complications. Demonstrates understanding / competency   Patient understands how to develop strategies to address psychosocial issues. Demonstrates understanding / competency   Patient understands how to develop strategies to promote health/change behavior. Demonstrates understanding / competency     Outcomes   Program Status Completed      Learning Objective:  Patient will have a greater understanding of diabetes self-management. Patient education plan is to attend individual and/or group sessions per assessed needs and concerns. Patient has lost 4lbs since initial visit on 10/07/16. She has made changes to decrease food portions and control carbohydrate intake.    Plan:   Patient Instructions   Use food lists and menus provided to plan nutritionally balanced meals.   Try including a small snack at bedtime if several hours after supper, see if it helps keep morning blood sugar in good control.   Keep up your good portion control  and make healthy food choices, good job so far!  Keep up some walking or other exercise.     Expected Outcomes:  Demonstrated interest in learning. Expect positive outcomes  Education material provided: Planning A Balanced Meal; MyPlate Planner; Quick and Healthy Meal  Ideas  If problems or questions, patient to contact team via:  Phone  Future DSME appointment: -  prn

## 2016-10-25 IMAGING — MG MM DIGITAL SCREENING BILAT W/ CAD
1 series · 4 of 4 positions shown · non-contrast
Comparison: Previous exam(s).

CLINICAL DATA: Screening.

EXAM:
DIGITAL SCREENING BILATERAL MAMMOGRAM WITH CAD

[R CC · right · 4 of 4 slices shown]
[im 1/4]
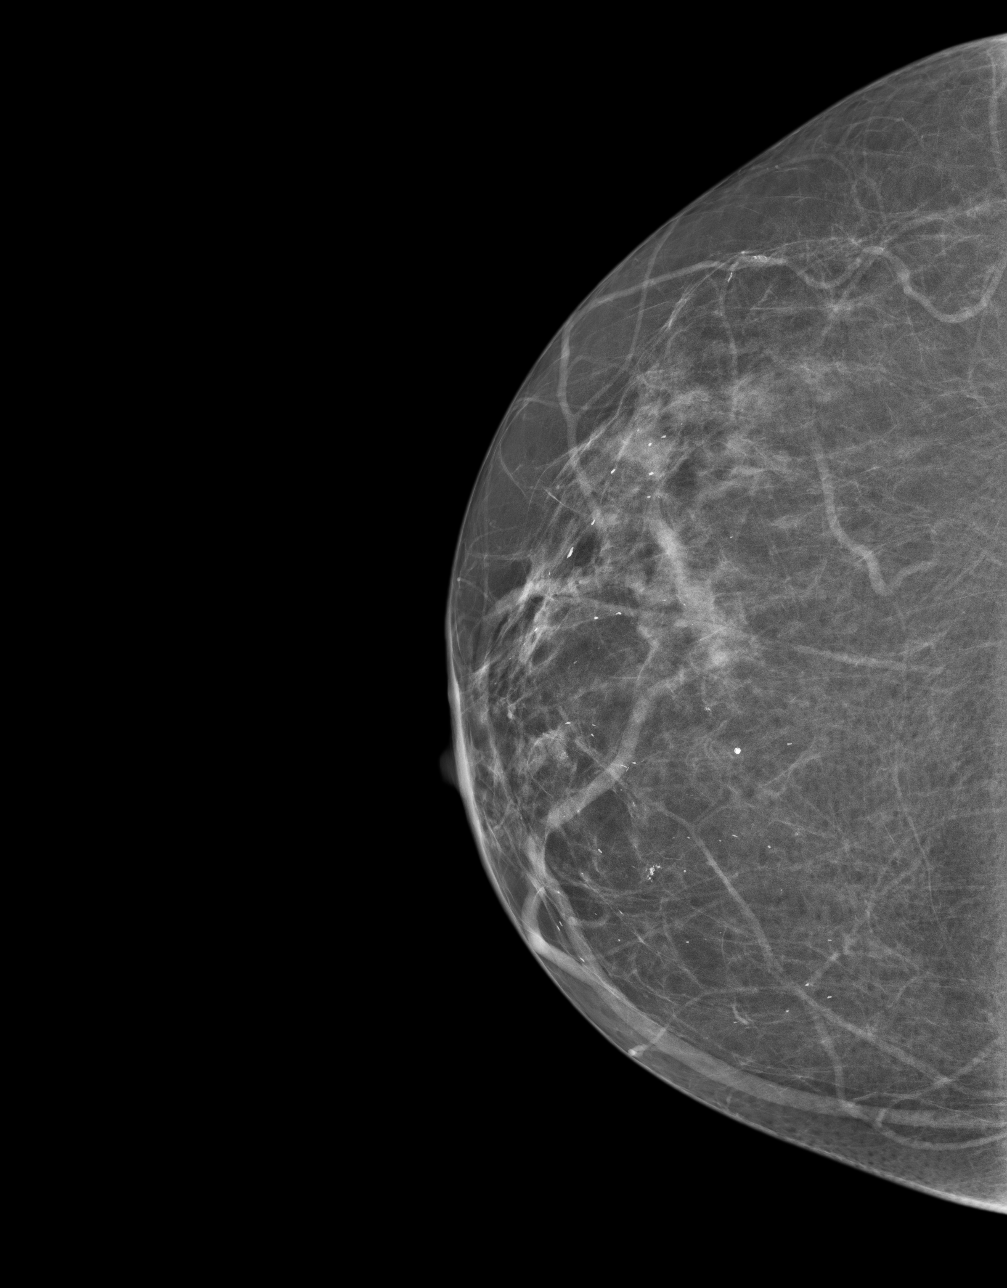
[im 2/4]
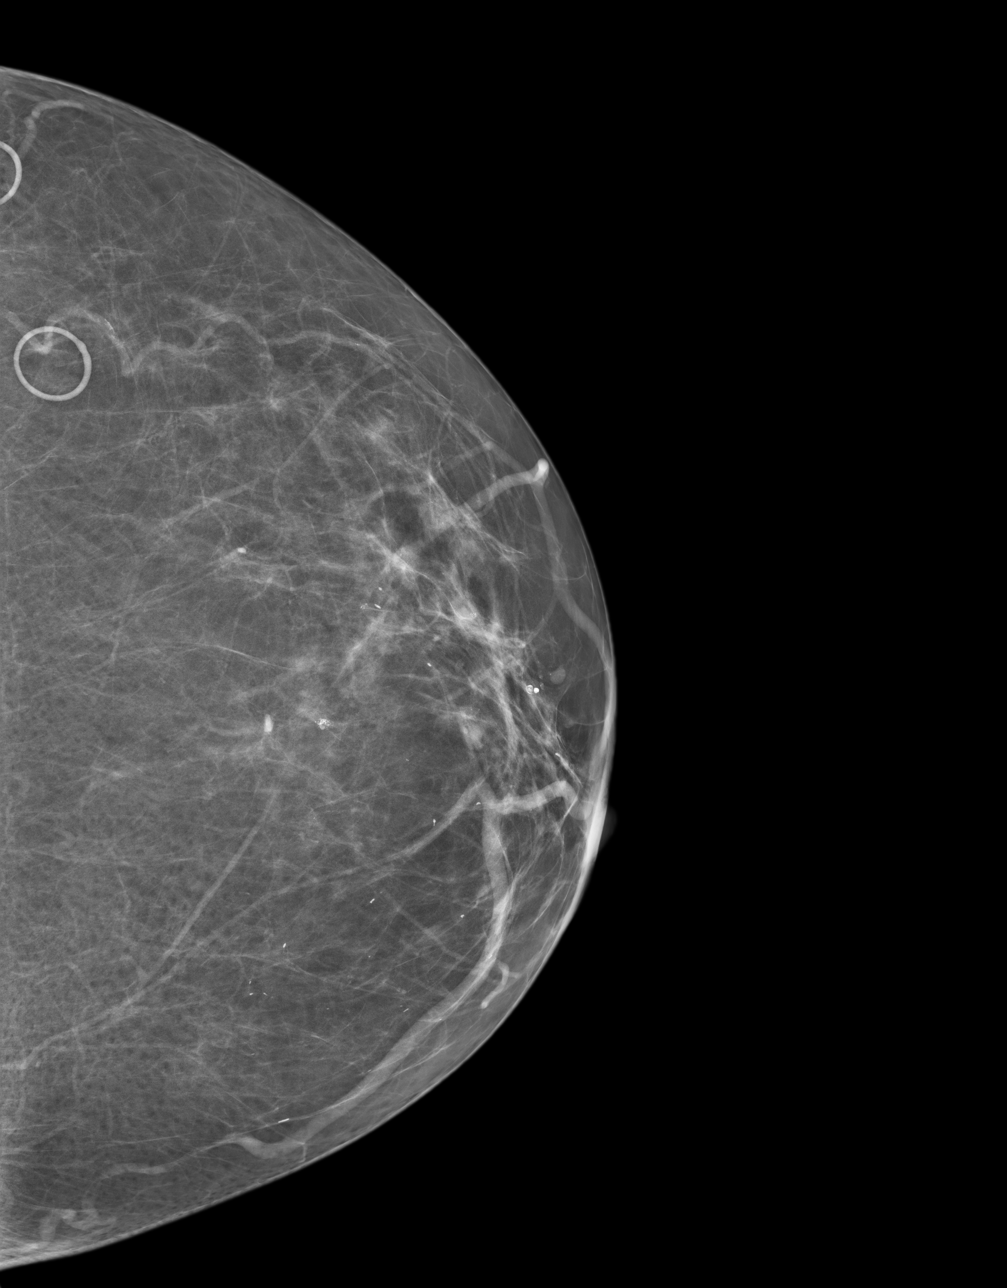
[im 3/4]
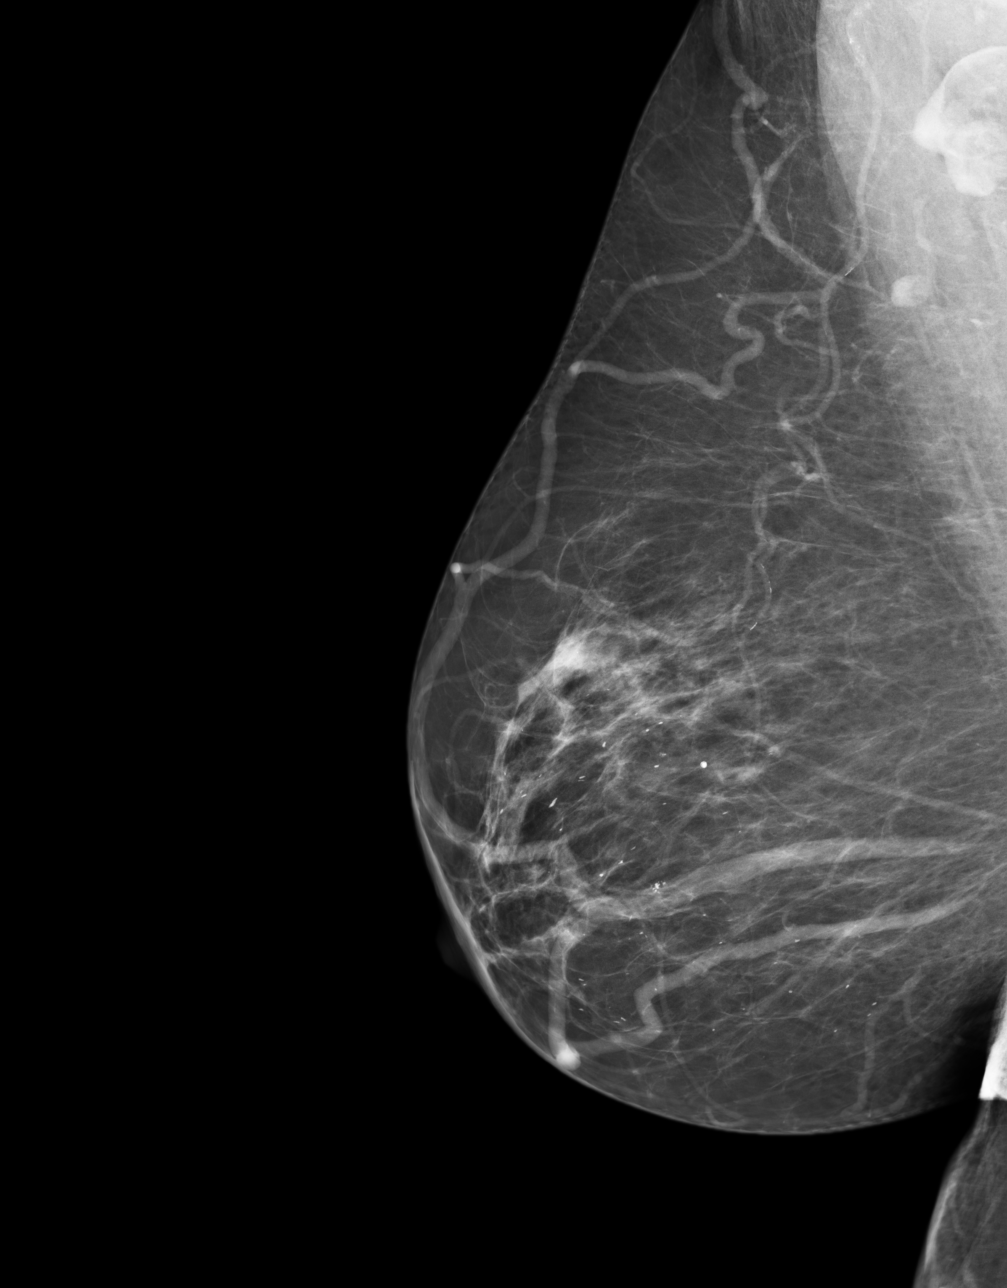
[im 4/4]
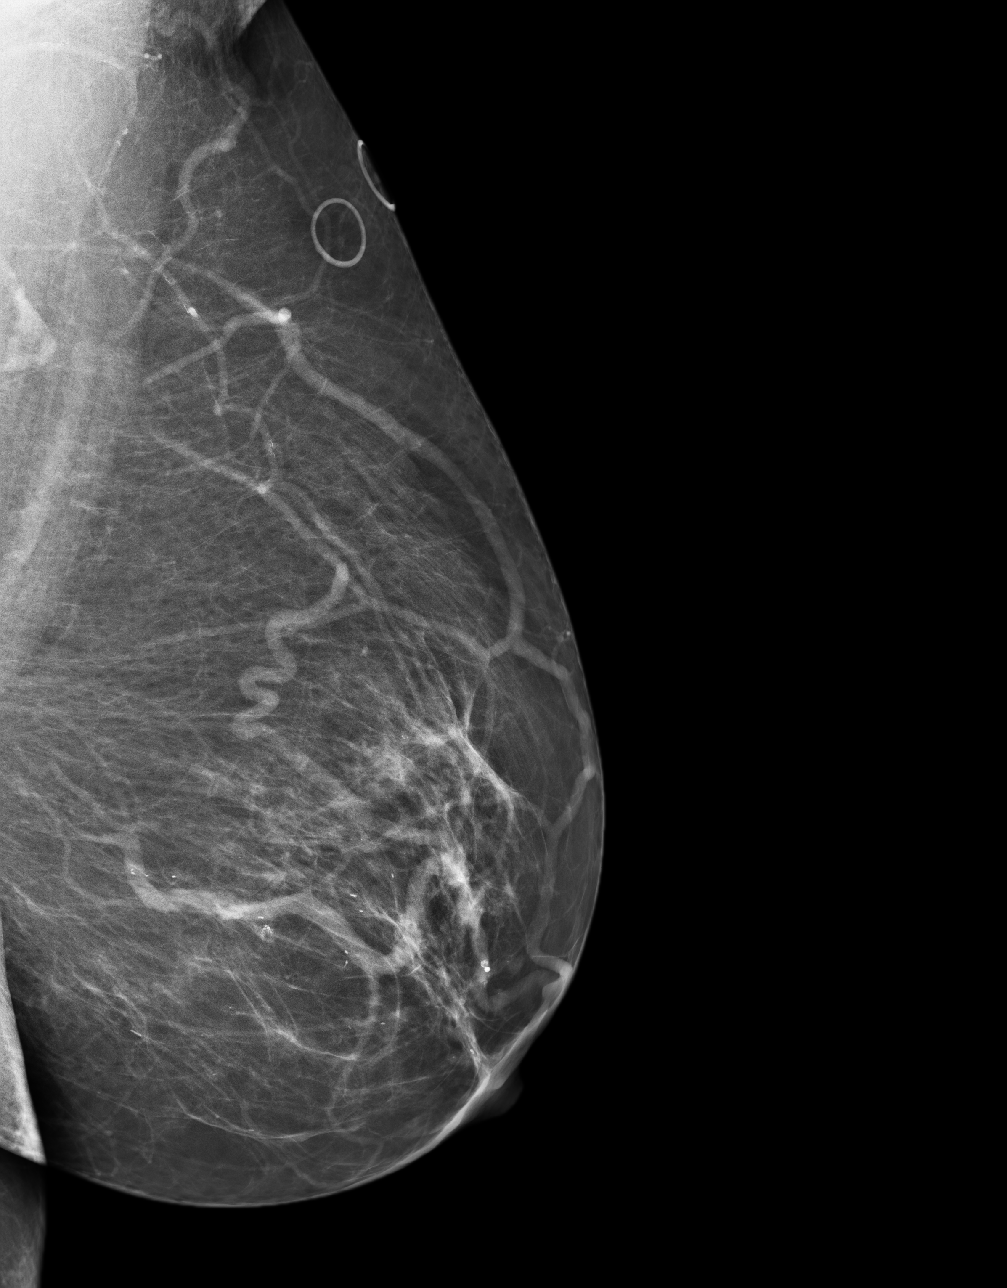

[4 of 4 positions shown; findings below may reference images not displayed]

ACR Breast Density Category b: There are scattered areas of
fibroglandular density.
FINDINGS: There are no findings suspicious for malignancy. Images were
processed with CAD.
IMPRESSION: No mammographic evidence of malignancy. A result letter of this
screening mammogram will be mailed directly to the patient.

RECOMMENDATION:
Screening mammogram in one year. (Code:AS-G-LCT)

BI-RADS CATEGORY  1: Negative.

## 2016-10-26 IMAGING — CR DG CHEST 1V PORT
1 series · 1 of 1 positions shown · non-contrast
Comparison: June 26, 2013

CLINICAL DATA: Chest pain

EXAM:
PORTABLE CHEST 1 VIEW

[ap]
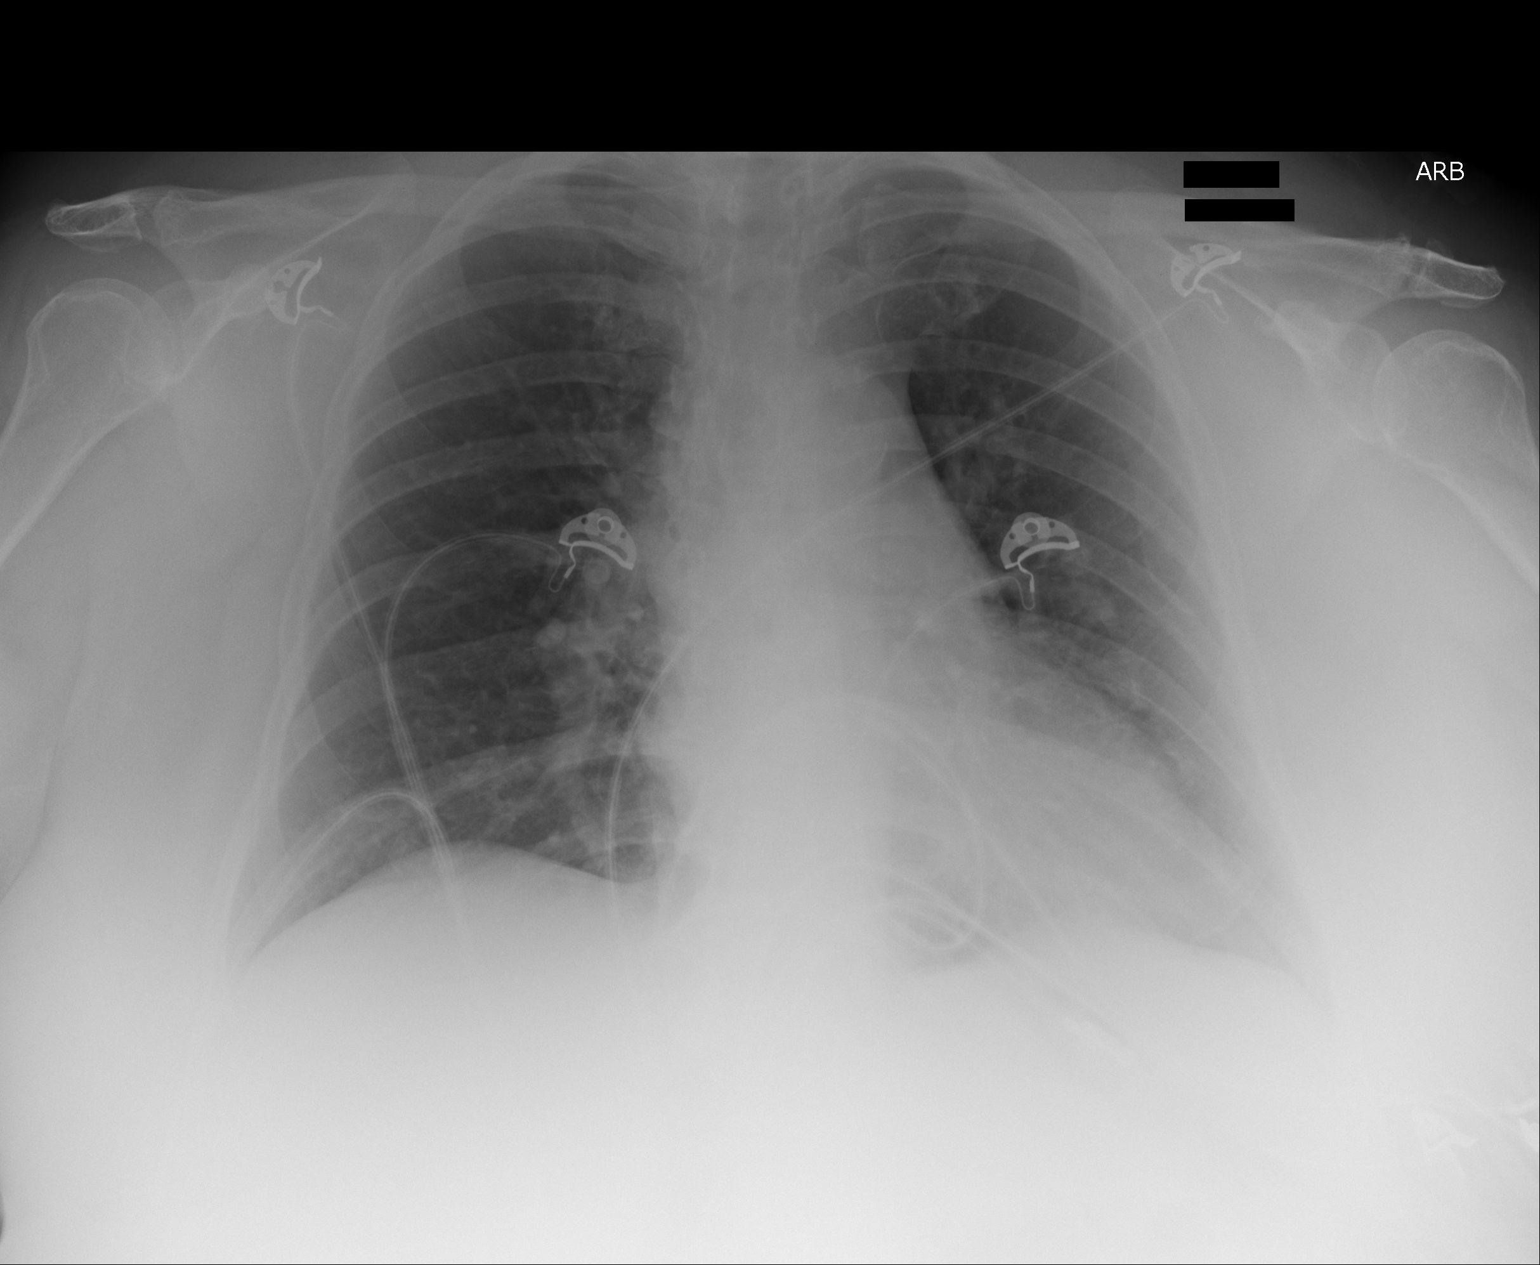

[1 of 1 positions shown; findings below may reference images not displayed]

FINDINGS: There is no edema or consolidation. The heart size and pulmonary
vascularity are normal. No adenopathy. No bone lesions.
IMPRESSION: No edema or consolidation.

## 2016-10-30 NOTE — Telephone Encounter (Signed)
ERROR

## 2017-03-28 ENCOUNTER — Ambulatory Visit: Payer: BLUE CROSS/BLUE SHIELD | Admitting: Anesthesiology

## 2017-03-28 ENCOUNTER — Encounter
Admission: RE | Disposition: A | Discharge status: Home / Self Care | Payer: Self-pay | Source: Ambulatory Visit | Attending: Gastroenterology

## 2017-03-28 ENCOUNTER — Encounter: Payer: Self-pay | Admitting: *Deleted

## 2017-03-28 ENCOUNTER — Ambulatory Visit
Admission: RE | Admit: 2017-03-28 | Discharge: 2017-03-28 | Disposition: A | Discharge status: Home / Self Care | Payer: BLUE CROSS/BLUE SHIELD | Source: Ambulatory Visit | Attending: Gastroenterology | Admitting: Gastroenterology

## 2017-03-28 DIAGNOSIS — Z0389 Encounter for observation for other suspected diseases and conditions ruled out: Secondary | ICD-10-CM | POA: Diagnosis not present

## 2017-03-28 DIAGNOSIS — G4730 Sleep apnea, unspecified: Secondary | ICD-10-CM | POA: Insufficient documentation

## 2017-03-28 DIAGNOSIS — Z79899 Other long term (current) drug therapy: Secondary | ICD-10-CM | POA: Insufficient documentation

## 2017-03-28 DIAGNOSIS — F419 Anxiety disorder, unspecified: Secondary | ICD-10-CM | POA: Diagnosis not present

## 2017-03-28 DIAGNOSIS — Z538 Procedure and treatment not carried out for other reasons: Secondary | ICD-10-CM | POA: Insufficient documentation

## 2017-03-28 DIAGNOSIS — I10 Essential (primary) hypertension: Secondary | ICD-10-CM | POA: Diagnosis not present

## 2017-03-28 DIAGNOSIS — M1990 Unspecified osteoarthritis, unspecified site: Secondary | ICD-10-CM | POA: Diagnosis not present

## 2017-03-28 DIAGNOSIS — F329 Major depressive disorder, single episode, unspecified: Secondary | ICD-10-CM | POA: Diagnosis not present

## 2017-03-28 DIAGNOSIS — E119 Type 2 diabetes mellitus without complications: Secondary | ICD-10-CM | POA: Insufficient documentation

## 2017-03-28 HISTORY — DX: Allergy, unspecified, initial encounter: T78.40XA

## 2017-03-28 HISTORY — DX: Dislocation of jaw, unspecified side, initial encounter: S03.00XA

## 2017-03-28 HISTORY — DX: Deficiency of other specified B group vitamins: E53.8

## 2017-03-28 HISTORY — DX: Hyperlipidemia, unspecified: E78.5

## 2017-03-28 HISTORY — DX: Radial styloid tenosynovitis (de quervain): M65.4

## 2017-03-28 HISTORY — DX: Hyperkalemia: E87.5

## 2017-03-28 HISTORY — DX: Psychophysiologic insomnia: F51.04

## 2017-03-28 LAB — GLUCOSE, CAPILLARY: Glucose-Capillary: 118 mg/dL — ABNORMAL HIGH (ref 65–99)

## 2017-03-28 SURGERY — COLONOSCOPY WITH PROPOFOL
Anesthesia: General

## 2017-03-28 MED ORDER — PROPOFOL 500 MG/50ML IV EMUL
INTRAVENOUS | Status: AC
  Filled 2017-03-28: qty 50

## 2017-03-28 MED ORDER — MIDAZOLAM HCL 2 MG/2ML IJ SOLN
INTRAMUSCULAR | Status: AC
  Filled 2017-03-28: qty 2

## 2017-03-28 MED ORDER — FENTANYL CITRATE (PF) 100 MCG/2ML IJ SOLN
INTRAMUSCULAR | Status: AC
  Filled 2017-03-28: qty 2

## 2017-03-28 MED ORDER — SODIUM CHLORIDE 0.9 % IV SOLN: INTRAVENOUS | Status: DC

## 2017-03-28 NOTE — H&P (Signed)
Patient presented today for colonoscopy. She had some difficulties with the prep last night, throwing up a lot of this. On rectal examination today there was some formed stool. As such we'll hold off on the procedure for now and we will rearrange her procedure and 4 different type of prep.

## 2017-03-28 NOTE — Anesthesia Preprocedure Evaluation (Signed)
Anesthesia Evaluation  Patient identified by MRN, date of birth, ID band Patient awake    Reviewed: Allergy & Precautions, NPO status , Patient's Chart, lab work & pertinent test results, reviewed documented beta blocker date and time   Airway Mallampati: II  TM Distance: >3 FB     Dental  (+) Chipped   Pulmonary sleep apnea , pneumonia, resolved,           Cardiovascular hypertension, Pt. on medications and Pt. on home beta blockers + angina      Neuro/Psych  Headaches, PSYCHIATRIC DISORDERS Anxiety Depression    GI/Hepatic hiatal hernia,   Endo/Other  diabetes, Type 2  Renal/GU      Musculoskeletal  (+) Arthritis ,   Abdominal   Peds  Hematology   Anesthesia Other Findings   Reproductive/Obstetrics                             Anesthesia Physical Anesthesia Plan  ASA: III  Anesthesia Plan: General   Post-op Pain Management:    Induction: Intravenous  PONV Risk Score and Plan:   Airway Management Planned:   Additional Equipment:   Intra-op Plan:   Post-operative Plan:   Informed Consent: I have reviewed the patients History and Physical, chart, labs and discussed the procedure including the risks, benefits and alternatives for the proposed anesthesia with the patient or authorized representative who has indicated his/her understanding and acceptance.     Plan Discussed with: CRNA  Anesthesia Plan Comments:         Anesthesia Quick Evaluation

## 2017-08-28 ENCOUNTER — Other Ambulatory Visit: Payer: Self-pay | Admitting: Family Medicine

## 2017-08-28 DIAGNOSIS — Z1231 Encounter for screening mammogram for malignant neoplasm of breast: Secondary | ICD-10-CM

## 2017-09-10 ENCOUNTER — Ambulatory Visit
Admission: RE | Admit: 2017-09-10 | Discharge: 2017-09-10 | Disposition: A | Discharge status: Home / Self Care | Payer: BLUE CROSS/BLUE SHIELD | Source: Ambulatory Visit | Attending: Family Medicine | Admitting: Family Medicine

## 2017-09-10 DIAGNOSIS — Z1231 Encounter for screening mammogram for malignant neoplasm of breast: Secondary | ICD-10-CM | POA: Insufficient documentation

## 2017-09-15 ENCOUNTER — Encounter
Admission: RE | Disposition: A | Discharge status: Home / Self Care | Payer: Self-pay | Source: Ambulatory Visit | Attending: Gastroenterology

## 2017-09-15 ENCOUNTER — Ambulatory Visit: Payer: BLUE CROSS/BLUE SHIELD | Admitting: Anesthesiology

## 2017-09-15 ENCOUNTER — Encounter: Payer: Self-pay | Admitting: *Deleted

## 2017-09-15 ENCOUNTER — Ambulatory Visit
Admission: RE | Admit: 2017-09-15 | Discharge: 2017-09-15 | Disposition: A | Discharge status: Home / Self Care | Payer: BLUE CROSS/BLUE SHIELD | Source: Ambulatory Visit | Attending: Gastroenterology | Admitting: Gastroenterology

## 2017-09-15 DIAGNOSIS — Z881 Allergy status to other antibiotic agents status: Secondary | ICD-10-CM | POA: Diagnosis not present

## 2017-09-15 DIAGNOSIS — G4730 Sleep apnea, unspecified: Secondary | ICD-10-CM | POA: Insufficient documentation

## 2017-09-15 DIAGNOSIS — I10 Essential (primary) hypertension: Secondary | ICD-10-CM | POA: Diagnosis not present

## 2017-09-15 DIAGNOSIS — E538 Deficiency of other specified B group vitamins: Secondary | ICD-10-CM | POA: Diagnosis not present

## 2017-09-15 DIAGNOSIS — Z6833 Body mass index (BMI) 33.0-33.9, adult: Secondary | ICD-10-CM | POA: Insufficient documentation

## 2017-09-15 DIAGNOSIS — Z7982 Long term (current) use of aspirin: Secondary | ICD-10-CM | POA: Diagnosis not present

## 2017-09-15 DIAGNOSIS — F329 Major depressive disorder, single episode, unspecified: Secondary | ICD-10-CM | POA: Insufficient documentation

## 2017-09-15 DIAGNOSIS — E669 Obesity, unspecified: Secondary | ICD-10-CM | POA: Diagnosis not present

## 2017-09-15 DIAGNOSIS — E7800 Pure hypercholesterolemia, unspecified: Secondary | ICD-10-CM | POA: Diagnosis not present

## 2017-09-15 DIAGNOSIS — Z79899 Other long term (current) drug therapy: Secondary | ICD-10-CM | POA: Insufficient documentation

## 2017-09-15 DIAGNOSIS — Z86010 Personal history of colonic polyps: Secondary | ICD-10-CM | POA: Diagnosis not present

## 2017-09-15 DIAGNOSIS — Z1211 Encounter for screening for malignant neoplasm of colon: Secondary | ICD-10-CM | POA: Insufficient documentation

## 2017-09-15 DIAGNOSIS — D128 Benign neoplasm of rectum: Secondary | ICD-10-CM | POA: Insufficient documentation

## 2017-09-15 DIAGNOSIS — E119 Type 2 diabetes mellitus without complications: Secondary | ICD-10-CM | POA: Diagnosis not present

## 2017-09-15 DIAGNOSIS — Z538 Procedure and treatment not carried out for other reasons: Secondary | ICD-10-CM | POA: Insufficient documentation

## 2017-09-15 DIAGNOSIS — F419 Anxiety disorder, unspecified: Secondary | ICD-10-CM | POA: Insufficient documentation

## 2017-09-15 DIAGNOSIS — M1990 Unspecified osteoarthritis, unspecified site: Secondary | ICD-10-CM | POA: Diagnosis not present

## 2017-09-15 DIAGNOSIS — Z794 Long term (current) use of insulin: Secondary | ICD-10-CM | POA: Insufficient documentation

## 2017-09-15 HISTORY — DX: Benign neoplasm of colon, unspecified: D12.6

## 2017-09-15 HISTORY — PX: COLONOSCOPY WITH PROPOFOL: SHX5780

## 2017-09-15 HISTORY — DX: Hypomagnesemia: E83.42

## 2017-09-15 HISTORY — DX: Vitamin D deficiency, unspecified: E55.9

## 2017-09-15 HISTORY — DX: Obesity, unspecified: E66.9

## 2017-09-15 LAB — GLUCOSE, CAPILLARY: GLUCOSE-CAPILLARY: 138 mg/dL — AB (ref 65–99)

## 2017-09-15 SURGERY — COLONOSCOPY WITH PROPOFOL
Anesthesia: General

## 2017-09-15 MED ORDER — SODIUM CHLORIDE 0.9 % IV SOLN
INTRAVENOUS | Status: DC
  Administered 2017-09-15: 08:00:00 via INTRAVENOUS

## 2017-09-15 MED ORDER — PROPOFOL 500 MG/50ML IV EMUL
INTRAVENOUS | Status: DC | PRN
  Administered 2017-09-15: 130 ug/kg/min via INTRAVENOUS

## 2017-09-15 MED ORDER — SODIUM CHLORIDE 0.9 % IV SOLN: INTRAVENOUS | Status: DC

## 2017-09-15 MED ORDER — PROPOFOL 10 MG/ML IV BOLUS
INTRAVENOUS | Status: DC | PRN
  Administered 2017-09-15: 30 mg via INTRAVENOUS
  Administered 2017-09-15: 20 mg via INTRAVENOUS
  Administered 2017-09-15: 10 mg via INTRAVENOUS
  Administered 2017-09-15: 40 mg via INTRAVENOUS

## 2017-09-15 MED ORDER — PROPOFOL 500 MG/50ML IV EMUL
INTRAVENOUS | Status: AC
  Filled 2017-09-15: qty 50

## 2017-09-15 NOTE — Anesthesia Postprocedure Evaluation (Signed)
Anesthesia Post Note  Patient: Diana Roberts  Procedure(s) Performed: COLONOSCOPY WITH PROPOFOL (N/A )  Patient location during evaluation: PACU Anesthesia Type: General Level of consciousness: awake and alert and oriented Pain management: pain level controlled Vital Signs Assessment: post-procedure vital signs reviewed and stable Respiratory status: spontaneous breathing Cardiovascular status: blood pressure returned to baseline Anesthetic complications: no     Last Vitals:  Vitals:   09/15/17 0830 09/15/17 0840  BP: 135/67 111/64  Pulse: (!) 59 60  Resp: 18 (!) 23  Temp:    SpO2: 96% 95%    Last Pain:  Vitals:   09/15/17 0810  TempSrc: Tympanic                 Dylana Shaw

## 2017-09-15 NOTE — Transfer of Care (Signed)
Immediate Anesthesia Transfer of Care Note  Patient: Diana Roberts  Procedure(s) Performed: COLONOSCOPY WITH PROPOFOL (N/A )  Patient Location: PACU  Anesthesia Type:General  Level of Consciousness: awake, oriented and sedated  Airway & Oxygen Therapy: Patient Spontanous Breathing and Patient connected to nasal cannula oxygen  Post-op Assessment: Report given to RN and Post -op Vital signs reviewed and stable  Post vital signs: Reviewed and stable  Last Vitals:  Vitals:   09/15/17 0708  BP: 134/67  Pulse: 66  Resp: 14  Temp: 36.7 C  SpO2: 98%    Last Pain:  Vitals:   09/15/17 0708  TempSrc: Tympanic         Complications: No apparent anesthesia complications

## 2017-09-15 NOTE — Op Note (Signed)
[facility] Patient Name: Diana Roberts Procedure Date: 09/15/2017 7:41 AM MRN: [id number] Account #: [id number] Date of Birth: 07/05/1951 Admit Type: Outpatient Age: 67 Room: [facility] ENDO ROOM 1 Gender: Female Note Status: Finalized Procedure:            Colonoscopy Indications:          Personal history of colonic polyps Providers:            Lollie Sails, MD Referring MD:         , Thurmon Fair. Komives (Referring MD) Medicines:            Monitored Anesthesia Care Complications:        No immediate complications. Procedure:            Pre-Anesthesia Assessment:                       - ASA Grade Assessment: III - A patient with severe                        systemic disease.                       After obtaining informed consent, the colonoscope was                        passed under direct vision. Throughout the procedure,                        the patient's blood pressure, pulse, and oxygen                        saturations were monitored continuously. The Olympus                        PCF-H180AL colonoscope ( S#: [id number] ) was introduced                        through the anus with the intention of advancing to the                        cecum. The scope was advanced to the descending colon                        before the procedure was aborted. Medications were                        given. The colonoscopy was unusually difficult due to                        poor bowel prep with stool present. The patient                        tolerated the procedure well. The quality of the bowel                        preparation was poor. Findings:      A 4 mm polyp was found in the rectum. The polyp was sessile. The polyp       was removed with a cold snare. Resection and retrieval were complete.  A large amount of semi-solid stool was found in the rectum, in the       sigmoid colon, in the descending colon and in the distal descending   colon, precluding visualization.      The digital rectal exam was normal. Impression:           - Preparation of the colon was poor.                       - One 4 mm polyp in the rectum, removed with a cold                        snare. Resected and retrieved.                       - Stool in the rectum, in the sigmoid colon, in the                        descending colon and in the distal descending colon. Recommendation:       - Await pathology results.                       - reprep and reschedule procedure.                       - Discharge patient to home. Procedure Code(s):    --- Professional ---                       [phone], 52, Colonoscopy, flexible; with removal of                        tumor(s), polyp(s), or other lesion(s) by snare                        technique Diagnosis Code(s):    --- Professional ---                       K62.1, Rectal polyp                       Z86.010, Personal history of colonic polyps CPT copyright 2016 [employer]. All rights reserved. The codes documented in this report are preliminary and upon coder review may  be revised to meet current compliance requirements. Lollie Sails, MD 09/15/2017 8:11:03 AM This report has been signed electronically. Number of Addenda: 0 Note Initiated On: 09/15/2017 7:41 AM Total Procedure Duration: 0 hours 18 minutes 21 seconds       [facility]

## 2017-09-15 NOTE — H&P (Signed)
Outpatient short stay form Pre-procedure 09/15/2017 7:38 AM Diana Sails MD  Primary Physician: Rolinda Roan, MD  Reason for visit: Colonoscopy  History of present illness: Patient is a 67 year old female presenting today as above.  She tolerated her prep well.  She takes no aspirin or blood thinning agent with the exception of 81 mg aspirin she has not taken them for several weeks actually.    Current Facility-Administered Medications:  .  0.9 %  sodium chloride infusion, , Intravenous, Continuous, Diana Sails, MD, Last Rate: 20 mL/hr at 09/15/17 0730 .  0.9 %  sodium chloride infusion, , Intravenous, Continuous, Diana Sails, MD  Medications Prior to Admission  Medication Sig Dispense Refill Last Dose  . aspirin EC 81 MG tablet Take 81 mg by mouth daily.   Past Week at Unknown time  . buPROPion (WELLBUTRIN XL) 150 MG 24 hr tablet Take 150 mg by mouth daily.   09/14/2017 at Unknown time  . carvedilol (COREG) 6.25 MG tablet Take 6.25 mg by mouth 2 (two) times daily with a meal.   09/14/2017 at 2100  . Cholecalciferol 2000 UNITS CAPS Take 2,000 Units by mouth daily.    Past Week at Unknown time  . clonazePAM (KLONOPIN) 0.5 MG tablet Take 0.5 mg by mouth 2 (two) times daily.   09/14/2017 at Unknown time  . cyanocobalamin (,VITAMIN B-12,) 1000 MCG/ML injection Inject 1,000 mcg into the muscle every 30 (thirty) days.   Past Week at Unknown time  . cyanocobalamin 2000 MCG tablet Take 2,000 mcg by mouth daily.   09/14/2017 at Unknown time  . empagliflozin (JARDIANCE) 10 MG TABS tablet Take 10 mg by mouth daily.   09/14/2017 at Unknown time  . Ferrous Gluconate (IRON 27 PO) Take 1 tablet by mouth daily.   Past Week at Unknown time  . GARLIC PO Take 1 tablet by mouth at bedtime.    Past Week at Unknown time  . insulin glargine (LANTUS) 100 UNIT/ML injection Inject 70 Units into the skin at bedtime.    Past Week at Unknown time  . lisinopril (PRINIVIL,ZESTRIL) 40 MG tablet Take 20 mg by  mouth daily.   Past Week at Unknown time  . magnesium oxide (MAG-OX) 400 MG tablet Take 400 mg by mouth 2 (two) times daily.   09/14/2017 at Unknown time  . metFORMIN (GLUCOPHAGE) 1000 MG tablet Take 1,000 mg by mouth daily with breakfast.    09/14/2017 at Unknown time  . Multiple Vitamin (MULTIVITAMIN) tablet Take 1 tablet by mouth daily.   Past Week at Unknown time  . naproxen sodium (ANAPROX) 550 MG tablet Take 550 mg by mouth 2 (two) times daily with a meal.     . omega-3 acid ethyl esters (LOVAZA) 1 G capsule Take 1 g by mouth at bedtime.   Past Week at Unknown time  . oxybutynin (DITROPAN-XL) 5 MG 24 hr tablet Take 5 mg by mouth at bedtime.   09/14/2017 at Unknown time  . sertraline (ZOLOFT) 100 MG tablet Take 100 mg by mouth daily.   09/14/2017 at Unknown time  . simvastatin (ZOCOR) 40 MG tablet Take 40 mg by mouth at bedtime.    09/14/2017 at Unknown time  . Acetaminophen (TYLENOL PO) Take 500 mg by mouth as needed.   Past Month at Unknown time  . albuterol (PROVENTIL HFA;VENTOLIN HFA) 108 (90 BASE) MCG/ACT inhaler Inhale 2 puffs into the lungs every 6 (six) hours as needed for wheezing or shortness of breath.  Past Month at Unknown time  . hydrochlorothiazide (HYDRODIURIL) 12.5 MG tablet Take 12.5 mg by mouth daily.   Not Taking at Unknown time  . ibuprofen (ADVIL,MOTRIN) 800 MG tablet Take 1 tablet by mouth every 6 (six) hours as needed.   Taking  . insulin aspart (NOVOLOG) 100 UNIT/ML injection Inject 10 Units into the skin 3 (three) times daily with meals. (Patient not taking: Reported on 10/07/2016) 10 mL 11 Not Taking  . liraglutide 18 MG/3ML SOPN Inject 1.8 mg into the skin daily.   Taking  . selenium sulfide (SELSUN) 2.5 % shampoo Apply 1 application topically daily as needed for irritation.        Allergies  Allergen Reactions  . Ciprofloxacin Hives  . Pseudoephedrine-Guaifenesin   . Tylenol Sinus Severe [Pseudoephed-Apap-Guaifenesin] Rash     Past Medical History:  Diagnosis Date   . Allergic state   . Anginal pain ([place])   . Anxiety   . Arthritis   . Chronic insomnia   . Colon adenomas   . De Quervain's disease (radial styloid tenosynovitis)   . Depression   . Diabetes mellitus without complication ([place])   . Headache    migraines  . History of hiatal hernia   . Hypercholesteremia   . Hyperkalemia   . Hyperlipidemia   . Hypertension   . Hypomagnesemia   . Obesity   . Seasonal allergies   . Sleep apnea    Report - Care everywhere - 06/09/2014  . TMJ (dislocation of temporomandibular joint)   . Vitamin B12 deficiency   . Vitamin D deficiency     Review of systems:      Physical Exam    Heart and lungs: Regular rate and rhythm without rub or gallop, lungs are bilaterally clear    HEENT: Normocephalic atraumatic eyes are anicteric    Other:    Pertinant exam for procedure: Soft nontender nondistended bowel sounds positive normoactive. I have discussed the risks benefits and complications of procedures to include not limited to bleeding, infection, perforation and the risk of sedation and the patient wishes to proceed.    Planned proceedures: Colonoscopy and indicated procedures. I have discussed the risks benefits and complications of procedures to include not limited to bleeding, infection, perforation and the risk of sedation and the patient wishes to proceed.    Diana Sails, MD Gastroenterology 09/15/2017  7:38 AM

## 2017-09-15 NOTE — Brief Op Note (Signed)
Procedure aborted due to poor prep. 60cm colon reached

## 2017-09-15 NOTE — Anesthesia Post-op Follow-up Note (Signed)
Anesthesia QCDR form completed.        

## 2017-09-15 NOTE — Anesthesia Preprocedure Evaluation (Signed)
Anesthesia Evaluation  Patient identified by MRN, date of birth, ID band Patient awake    Reviewed: Allergy & Precautions, NPO status , Patient's Chart, lab work & pertinent test results, reviewed documented beta blocker date and time   Airway Mallampati: II  TM Distance: >3 FB     Dental  (+) Chipped   Pulmonary sleep apnea , pneumonia, resolved,           Cardiovascular hypertension, Pt. on medications and Pt. on home beta blockers + angina      Neuro/Psych  Headaches, PSYCHIATRIC DISORDERS Anxiety Depression    GI/Hepatic hiatal hernia,   Endo/Other  diabetes, Type 2  Renal/GU      Musculoskeletal  (+) Arthritis ,   Abdominal   Peds  Hematology   Anesthesia Other Findings Past Medical History: No date: Allergic state No date: Anginal pain ([place]) No date: Anxiety No date: Arthritis No date: Chronic insomnia No date: Colon adenomas No date: De Quervain's disease (radial styloid tenosynovitis) No date: Depression No date: Diabetes mellitus without complication ([facility]) No date: Headache     Comment:  migraines No date: History of hiatal hernia No date: Hypercholesteremia No date: Hyperkalemia No date: Hyperlipidemia No date: Hypertension No date: Hypomagnesemia No date: Obesity No date: Seasonal allergies No date: Sleep apnea     Comment:  Report - Care everywhere - 06/09/2014 No date: TMJ (dislocation of temporomandibular joint) No date: Vitamin B12 deficiency No date: Vitamin D deficiency  Reproductive/Obstetrics                             Anesthesia Physical  Anesthesia Plan  ASA: III  Anesthesia Plan: General   Post-op Pain Management:    Induction: Intravenous  PONV Risk Score and Plan:   Airway Management Planned:   Additional Equipment:   Intra-op Plan:   Post-operative Plan:   Informed Consent: I have reviewed the patients History and Physical, chart,  labs and discussed the procedure including the risks, benefits and alternatives for the proposed anesthesia with the patient or authorized representative who has indicated his/her understanding and acceptance.     Plan Discussed with: CRNA  Anesthesia Plan Comments:         Anesthesia Quick Evaluation

## 2017-09-16 ENCOUNTER — Encounter: Payer: Self-pay | Admitting: Gastroenterology

## 2017-09-16 LAB — SURGICAL PATHOLOGY

## 2017-09-19 DIAGNOSIS — Z860100 Personal history of colon polyps, unspecified: Secondary | ICD-10-CM | POA: Insufficient documentation

## 2017-12-08 ENCOUNTER — Ambulatory Visit: Payer: BLUE CROSS/BLUE SHIELD | Admitting: Anesthesiology

## 2017-12-08 ENCOUNTER — Ambulatory Visit
Admission: RE | Admit: 2017-12-08 | Discharge: 2017-12-08 | Disposition: A | Discharge status: Home / Self Care | Payer: BLUE CROSS/BLUE SHIELD | Source: Ambulatory Visit | Attending: Gastroenterology | Admitting: Gastroenterology

## 2017-12-08 ENCOUNTER — Encounter
Admission: RE | Disposition: A | Discharge status: Home / Self Care | Payer: Self-pay | Source: Ambulatory Visit | Attending: Gastroenterology

## 2017-12-08 DIAGNOSIS — G4730 Sleep apnea, unspecified: Secondary | ICD-10-CM | POA: Diagnosis not present

## 2017-12-08 DIAGNOSIS — Z79899 Other long term (current) drug therapy: Secondary | ICD-10-CM | POA: Diagnosis not present

## 2017-12-08 DIAGNOSIS — Z794 Long term (current) use of insulin: Secondary | ICD-10-CM | POA: Insufficient documentation

## 2017-12-08 DIAGNOSIS — I10 Essential (primary) hypertension: Secondary | ICD-10-CM | POA: Diagnosis not present

## 2017-12-08 DIAGNOSIS — E7800 Pure hypercholesterolemia, unspecified: Secondary | ICD-10-CM | POA: Diagnosis not present

## 2017-12-08 DIAGNOSIS — Z86010 Personal history of colonic polyps: Secondary | ICD-10-CM | POA: Insufficient documentation

## 2017-12-08 DIAGNOSIS — Z7982 Long term (current) use of aspirin: Secondary | ICD-10-CM | POA: Insufficient documentation

## 2017-12-08 DIAGNOSIS — D122 Benign neoplasm of ascending colon: Secondary | ICD-10-CM | POA: Diagnosis not present

## 2017-12-08 DIAGNOSIS — E119 Type 2 diabetes mellitus without complications: Secondary | ICD-10-CM | POA: Diagnosis not present

## 2017-12-08 DIAGNOSIS — K5730 Diverticulosis of large intestine without perforation or abscess without bleeding: Secondary | ICD-10-CM | POA: Diagnosis not present

## 2017-12-08 DIAGNOSIS — F419 Anxiety disorder, unspecified: Secondary | ICD-10-CM | POA: Diagnosis not present

## 2017-12-08 DIAGNOSIS — E785 Hyperlipidemia, unspecified: Secondary | ICD-10-CM | POA: Diagnosis not present

## 2017-12-08 DIAGNOSIS — F329 Major depressive disorder, single episode, unspecified: Secondary | ICD-10-CM | POA: Diagnosis not present

## 2017-12-08 HISTORY — PX: COLONOSCOPY WITH PROPOFOL: SHX5780

## 2017-12-08 LAB — GLUCOSE, CAPILLARY: GLUCOSE-CAPILLARY: 175 mg/dL — AB (ref 65–99)

## 2017-12-08 SURGERY — COLONOSCOPY WITH PROPOFOL
Anesthesia: General

## 2017-12-08 MED ORDER — ONDANSETRON HCL 4 MG/2ML IJ SOLN: 4.0000 mg | Freq: Once | INTRAMUSCULAR | Status: DC | PRN

## 2017-12-08 MED ORDER — FENTANYL CITRATE (PF) 100 MCG/2ML IJ SOLN: 25.0000 ug | INTRAMUSCULAR | Status: DC | PRN

## 2017-12-08 MED ORDER — FENTANYL CITRATE (PF) 100 MCG/2ML IJ SOLN
INTRAMUSCULAR | Status: AC
  Filled 2017-12-08: qty 2

## 2017-12-08 MED ORDER — PROPOFOL 10 MG/ML IV BOLUS
INTRAVENOUS | Status: AC
  Filled 2017-12-08: qty 20

## 2017-12-08 MED ORDER — PROPOFOL 500 MG/50ML IV EMUL
INTRAVENOUS | Status: AC
  Filled 2017-12-08: qty 50

## 2017-12-08 MED ORDER — EPHEDRINE SULFATE 50 MG/ML IJ SOLN
INTRAMUSCULAR | Status: AC
  Filled 2017-12-08: qty 1

## 2017-12-08 MED ORDER — PROPOFOL 500 MG/50ML IV EMUL
INTRAVENOUS | Status: DC | PRN
  Administered 2017-12-08: 150 ug/kg/min via INTRAVENOUS

## 2017-12-08 MED ORDER — SODIUM CHLORIDE 0.9 % IV SOLN
INTRAVENOUS | Status: DC
  Administered 2017-12-08: 07:00:00 via INTRAVENOUS

## 2017-12-08 MED ORDER — SODIUM CHLORIDE 0.9 % IV SOLN
INTRAVENOUS | Status: DC
  Administered 2017-12-08: 1000 mL via INTRAVENOUS

## 2017-12-08 MED ORDER — SODIUM CHLORIDE 0.9 % IV SOLN: INTRAVENOUS | Status: DC

## 2017-12-08 MED ORDER — LIDOCAINE HCL (PF) 2 % IJ SOLN
INTRAMUSCULAR | Status: AC
  Filled 2017-12-08: qty 10

## 2017-12-08 MED ORDER — PROPOFOL 10 MG/ML IV BOLUS
INTRAVENOUS | Status: DC | PRN
  Administered 2017-12-08: 100 mg via INTRAVENOUS

## 2017-12-08 MED ORDER — LIDOCAINE 2% (20 MG/ML) 5 ML SYRINGE
INTRAMUSCULAR | Status: DC | PRN
  Administered 2017-12-08: 30 mg via INTRAVENOUS

## 2017-12-08 MED ORDER — FENTANYL CITRATE (PF) 100 MCG/2ML IJ SOLN
INTRAMUSCULAR | Status: DC | PRN
  Administered 2017-12-08 (×2): 50 ug via INTRAVENOUS

## 2017-12-08 MED ORDER — SODIUM CHLORIDE 0.9 % IJ SOLN
INTRAMUSCULAR | Status: AC
  Filled 2017-12-08: qty 10

## 2017-12-08 NOTE — Anesthesia Preprocedure Evaluation (Signed)
Anesthesia Evaluation  Patient identified by MRN, date of birth, ID band Patient awake    Reviewed: Allergy & Precautions, NPO status , Patient's Chart, lab work & pertinent test results  History of Anesthesia Complications Negative for: history of anesthetic complications  Airway Mallampati: III  TM Distance: >3 FB Neck ROM: Full    Dental no notable dental hx.    Pulmonary sleep apnea and Continuous Positive Airway Pressure Ventilation , neg COPD,    breath sounds clear to auscultation- rhonchi (-) wheezing      Cardiovascular hypertension, Pt. on medications (-) CAD, (-) Past MI, (-) Cardiac Stents and (-) CABG  Rhythm:Regular Rate:Normal - Systolic murmurs and - Diastolic murmurs    Neuro/Psych  Headaches, PSYCHIATRIC DISORDERS Anxiety Depression    GI/Hepatic Neg liver ROS, hiatal hernia,   Endo/Other  diabetes, Insulin Dependent  Renal/GU negative Renal ROS     Musculoskeletal  (+) Arthritis ,   Abdominal (+) + obese,   Peds  Hematology negative hematology ROS (+)   Anesthesia Other Findings Past Medical History: No date: Allergic state No date: Anginal pain ([place]) No date: Anxiety No date: Arthritis No date: Chronic insomnia No date: Colon adenomas No date: De Quervain's disease (radial styloid tenosynovitis) No date: Depression No date: Diabetes mellitus without complication ([facility]) No date: Headache     Comment:  migraines No date: History of hiatal hernia No date: Hypercholesteremia No date: Hyperkalemia No date: Hyperlipidemia No date: Hypertension No date: Hypomagnesemia No date: Obesity No date: Seasonal allergies No date: Sleep apnea     Comment:  Report - Care everywhere - 06/09/2014 No date: TMJ (dislocation of temporomandibular joint) No date: Vitamin B12 deficiency No date: Vitamin D deficiency   Reproductive/Obstetrics                             Anesthesia  Physical Anesthesia Plan  ASA: III  Anesthesia Plan: General   Post-op Pain Management:    Induction: Intravenous  PONV Risk Score and Plan: 2 and Propofol infusion  Airway Management Planned: Natural Airway  Additional Equipment:   Intra-op Plan:   Post-operative Plan:   Informed Consent: I have reviewed the patients History and Physical, chart, labs and discussed the procedure including the risks, benefits and alternatives for the proposed anesthesia with the patient or authorized representative who has indicated his/her understanding and acceptance.   Dental advisory given  Plan Discussed with: CRNA and Anesthesiologist  Anesthesia Plan Comments:         Anesthesia Quick Evaluation

## 2017-12-08 NOTE — H&P (Signed)
Outpatient short stay form Pre-procedure 12/08/2017 7:19 AM Diana Sails MD  Primary Physician: Dr Rolinda Roan  Reason for visit: Colonoscopy  History of present illness: Patient is a 67 year old female presenting today as above.  She has personal history of adenomatous colon polyps with her last procedure being done 09/15/2017.  This procedure was incomplete due to a very poor prep.  States her prep went better for her this time.  He does take 81 mg aspirin daily but no other aspirin products or blood thinning agents.  On her last procedure a 4 mm polyp was removed from rectum this being consistent with a tubulovillous adenoma.    Current Facility-Administered Medications:  .  0.9 %  sodium chloride infusion, , Intravenous, Continuous, Diana Sails, MD .  0.9 %  sodium chloride infusion, , Intravenous, Continuous, Diana Sails, MD, Last Rate: 20 mL/hr at 12/08/17 0715, 1,000 mL at 12/08/17 0715 .  fentaNYL (SUBLIMAZE) injection 25 mcg, 25 mcg, Intravenous, Q5 min PRN, Alvin Critchley, MD .  ondansetron [facility]) injection 4 mg, 4 mg, Intravenous, Once PRN, Alvin Critchley, MD  Medications Prior to Admission  Medication Sig Dispense Refill Last Dose  . Acetaminophen (TYLENOL PO) Take 500 mg by mouth as needed.   Past Week at Unknown time  . albuterol (PROVENTIL HFA;VENTOLIN HFA) 108 (90 BASE) MCG/ACT inhaler Inhale 2 puffs into the lungs every 6 (six) hours as needed for wheezing or shortness of breath.   Past Week at Unknown time  . aspirin EC 81 MG tablet Take 81 mg by mouth daily.   Past Week at Unknown time  . buPROPion (WELLBUTRIN XL) 150 MG 24 hr tablet Take 150 mg by mouth daily.   Past Week at Unknown time  . carvedilol (COREG) 6.25 MG tablet Take 6.25 mg by mouth 2 (two) times daily with a meal.   Past Week at Unknown time  . Cholecalciferol 2000 UNITS CAPS Take 2,000 Units by mouth daily.    Past Week at Unknown time  . clonazePAM (KLONOPIN) 0.5 MG tablet Take 0.5 mg  by mouth 2 (two) times daily.   Past Week at Unknown time  . cyanocobalamin (,VITAMIN B-12,) 1000 MCG/ML injection Inject 1,000 mcg into the muscle every 30 (thirty) days.   Past Week at Unknown time  . cyanocobalamin 2000 MCG tablet Take 2,000 mcg by mouth daily.   Past Week at Unknown time  . empagliflozin (JARDIANCE) 10 MG TABS tablet Take 10 mg by mouth daily.   Past Week at Unknown time  . Ferrous Gluconate (IRON 27 PO) Take 1 tablet by mouth daily.   Past Week at Unknown time  . GARLIC PO Take 1 tablet by mouth at bedtime.    Past Week at Unknown time  . hydrochlorothiazide (HYDRODIURIL) 12.5 MG tablet Take 12.5 mg by mouth daily.   Past Week at Unknown time  . ibuprofen (ADVIL,MOTRIN) 800 MG tablet Take 1 tablet by mouth every 6 (six) hours as needed.   Past Week at Unknown time  . insulin aspart (NOVOLOG) 100 UNIT/ML injection Inject 10 Units into the skin 3 (three) times daily with meals. 10 mL 11 Past Week at Unknown time  . insulin glargine (LANTUS) 100 UNIT/ML injection Inject 70 Units into the skin at bedtime.    Past Week at Unknown time  . lisinopril (PRINIVIL,ZESTRIL) 40 MG tablet Take 20 mg by mouth daily.   Past Week at Unknown time  . magnesium oxide (MAG-OX) 400 MG tablet  Take 400 mg by mouth 2 (two) times daily.   Past Week at Unknown time  . metFORMIN (GLUCOPHAGE) 1000 MG tablet Take 1,000 mg by mouth daily with breakfast.    Past Week at Unknown time  . Multiple Vitamin (MULTIVITAMIN) tablet Take 1 tablet by mouth daily.   Past Week at Unknown time  . naproxen sodium (ANAPROX) 550 MG tablet Take 550 mg by mouth 2 (two) times daily with a meal.   Past Week at Unknown time  . omega-3 acid ethyl esters (LOVAZA) 1 G capsule Take 1 g by mouth at bedtime.   Past Week at Unknown time  . oxybutynin (DITROPAN-XL) 5 MG 24 hr tablet Take 5 mg by mouth at bedtime.   Past Week at Unknown time  . selenium sulfide (SELSUN) 2.5 % shampoo Apply 1 application topically daily as needed for  irritation.   Past Week at Unknown time  . sertraline (ZOLOFT) 100 MG tablet Take 100 mg by mouth daily.   Past Week at Unknown time  . simvastatin (ZOCOR) 40 MG tablet Take 40 mg by mouth at bedtime.    Past Week at Unknown time  . liraglutide 18 MG/3ML SOPN Inject 1.8 mg into the skin daily.   Taking     Allergies  Allergen Reactions  . Ciprofloxacin Hives  . Pseudoephedrine-Guaifenesin   . Tylenol Sinus Severe [Pseudoephed-Apap-Guaifenesin] Rash     Past Medical History:  Diagnosis Date  . Allergic state   . Anginal pain ([place])   . Anxiety   . Arthritis   . Chronic insomnia   . Colon adenomas   . De Quervain's disease (radial styloid tenosynovitis)   . Depression   . Diabetes mellitus without complication ([place])   . Headache    migraines  . History of hiatal hernia   . Hypercholesteremia   . Hyperkalemia   . Hyperlipidemia   . Hypertension   . Hypomagnesemia   . Obesity   . Seasonal allergies   . Sleep apnea    Report - Care everywhere - 06/09/2014  . TMJ (dislocation of temporomandibular joint)   . Vitamin B12 deficiency   . Vitamin D deficiency     Review of systems:      Physical Exam    Heart and lungs: Regular rate and rhythm without rub or gallop, lungs are bilaterally clear.    HEENT: Normocephalic atraumatic eyes are anicteric    Other:    Pertinant exam for procedure: Soft nontender nondistended bowel sounds positive normoactive.    Planned proceedures: Colonoscopy and indicated procedures. I have discussed the risks benefits and complications of procedures to include not limited to bleeding, infection, perforation and the risk of sedation and the patient wishes to proceed.    Diana Sails, MD Gastroenterology 12/08/2017  7:19 AM

## 2017-12-08 NOTE — Op Note (Signed)
[facility] Gastroenterology Patient Name: Diana Roberts Procedure Date: 12/08/2017 7:16 AM MRN: [id number] Account #: [id number] Date of Birth: 1951/01/25 Admit Type: Outpatient Age: 67 Room: [facility] ENDO ROOM 1 Gender: Female Note Status: Finalized Procedure:            Colonoscopy Indications:          Personal history of colonic polyps Providers:            Lollie Sails, MD Referring MD:         Thurmon Fair. Komives (Referring MD) Complications:        No immediate complications. Procedure:            Pre-Anesthesia Assessment:                       - ASA Grade Assessment: III - A patient with severe                        systemic disease.                       After obtaining informed consent, the colonoscope was                        passed under direct vision. Throughout the procedure,                        the patient's blood pressure, pulse, and oxygen                        saturations were monitored continuously. The                        Colonoscope was introduced through the anus and                        advanced to the the cecum, identified by appendiceal                        orifice and ileocecal valve. The colonoscopy was                        unusually difficult due to a redundant colon.                        Successful completion of the procedure was aided by                        changing the patient to a supine position, changing the                        patient to a prone position and using manual pressure.                        The quality of the bowel preparation was good. Findings:      A 3 mm polyp was found in the ascending colon. The polyp was sessile.       The polyp was removed with a cold biopsy forceps. Resection and       retrieval were complete.      Multiple small and medium diverticula were  found in the sigmoid colon,       descending colon and transverse colon.      The retroflexed view of the distal rectum and  anal verge was normal and       showed no anal or rectal abnormalities.      The digital rectal exam was normal. Impression:           - One 3 mm polyp in the ascending colon, removed with a                        cold biopsy forceps. Resected and retrieved.                       - Diverticulosis in the sigmoid colon, in the                        descending colon and in the transverse colon.                       - The distal rectum and anal verge are normal on                        retroflexion view. Recommendation:       - Discharge patient to home.                       - Await pathology results.                       - Telephone GI clinic for pathology results in 1 week. Procedure Code(s):    --- Professional ---                       [phone], Colonoscopy, flexible; with biopsy, single or                        multiple Diagnosis Code(s):    --- Professional ---                       D12.2, Benign neoplasm of ascending colon                       Z86.010, Personal history of colonic polyps                       K57.30, Diverticulosis of large intestine without                        perforation or abscess without bleeding CPT copyright 2017 American Medical Association. All rights reserved. The codes documented in this report are preliminary and upon coder review may  be revised to meet current compliance requirements. Lollie Sails, MD 12/08/2017 8:19:51 AM This report has been signed electronically. Number of Addenda: 0 Note Initiated On: 12/08/2017 7:16 AM Scope Withdrawal Time: 0 hours 4 minutes 28 seconds  Total Procedure Duration: 0 hours 39 minutes 25 seconds       [facility]

## 2017-12-08 NOTE — Transfer of Care (Signed)
Immediate Anesthesia Transfer of Care Note  Patient: Diana Roberts  Procedure(s) Performed: COLONOSCOPY WITH PROPOFOL (N/A )  Patient Location: PACU and Endoscopy Unit  Anesthesia Type:General  Level of Consciousness: sedated  Airway & Oxygen Therapy: Patient Spontanous Breathing and Patient connected to nasal cannula oxygen  Post-op Assessment: Report given to RN and Post -op Vital signs reviewed and stable  Post vital signs: Reviewed and stable  Last Vitals:  Vitals Value Taken Time  BP 97/52 12/08/2017  8:21 AM  Temp 36.1 C 12/08/2017  8:20 AM  Pulse 64 12/08/2017  8:23 AM  Resp 17 12/08/2017  8:23 AM  SpO2 97 % 12/08/2017  8:23 AM  Vitals shown include unvalidated device data.  Last Pain:  Vitals:   12/08/17 0820  TempSrc: Tympanic  PainSc: 0-No pain         Complications: No apparent anesthesia complications

## 2017-12-08 NOTE — Anesthesia Post-op Follow-up Note (Signed)
Anesthesia QCDR form completed.        

## 2017-12-08 NOTE — Anesthesia Postprocedure Evaluation (Signed)
Anesthesia Post Note  Patient: Diana Roberts  Procedure(s) Performed: COLONOSCOPY WITH PROPOFOL (N/A )  Patient location during evaluation: Endoscopy Anesthesia Type: General Level of consciousness: awake and alert and oriented Pain management: pain level controlled Vital Signs Assessment: post-procedure vital signs reviewed and stable Respiratory status: spontaneous breathing, nonlabored ventilation and respiratory function stable Cardiovascular status: blood pressure returned to baseline and stable Postop Assessment: no signs of nausea or vomiting Anesthetic complications: no     Last Vitals:  Vitals:   12/08/17 0840 12/08/17 0850  BP: (!) 120/47 (!) 124/59  Pulse:  61  Resp:  17  Temp:    SpO2:  95%    Last Pain:  Vitals:   12/08/17 0840  TempSrc:   PainSc: 0-No pain                 [name]

## 2017-12-09 ENCOUNTER — Encounter: Payer: Self-pay | Admitting: Gastroenterology

## 2017-12-09 LAB — SURGICAL PATHOLOGY

## 2018-03-07 ENCOUNTER — Ambulatory Visit
Admission: EM | Admit: 2018-03-07 | Discharge: 2018-03-07 | Disposition: A | Discharge status: Home / Self Care | Payer: BLUE CROSS/BLUE SHIELD | Attending: Family Medicine | Admitting: Family Medicine

## 2018-03-07 ENCOUNTER — Other Ambulatory Visit: Payer: Self-pay

## 2018-03-07 ENCOUNTER — Encounter: Payer: Self-pay | Admitting: Gynecology

## 2018-03-07 DIAGNOSIS — W57XXXA Bitten or stung by nonvenomous insect and other nonvenomous arthropods, initial encounter: Secondary | ICD-10-CM

## 2018-03-07 DIAGNOSIS — S60562A Insect bite (nonvenomous) of left hand, initial encounter: Secondary | ICD-10-CM | POA: Diagnosis not present

## 2018-03-07 DIAGNOSIS — S90862A Insect bite (nonvenomous), left foot, initial encounter: Secondary | ICD-10-CM

## 2018-03-07 DIAGNOSIS — S60561A Insect bite (nonvenomous) of right hand, initial encounter: Secondary | ICD-10-CM | POA: Diagnosis not present

## 2018-03-07 DIAGNOSIS — S90861A Insect bite (nonvenomous), right foot, initial encounter: Secondary | ICD-10-CM

## 2018-03-07 MED ORDER — KETOROLAC TROMETHAMINE 60 MG/2ML IM SOLN: 60.0000 mg | Freq: Once | INTRAMUSCULAR | Status: DC

## 2018-03-07 MED ORDER — HYDROXYZINE HCL 25 MG PO TABS: 25.0000 mg | ORAL_TABLET | Freq: Three times a day (TID) | ORAL | 0 refills | Status: DC | PRN

## 2018-03-07 MED ORDER — CEPHALEXIN 500 MG PO CAPS: 500.0000 mg | ORAL_CAPSULE | Freq: Three times a day (TID) | ORAL | 0 refills | Status: AC

## 2018-03-07 NOTE — ED Triage Notes (Signed)
Per patient c/o insect bite on hands on legs x 1 week.

## 2018-03-07 NOTE — Discharge Instructions (Signed)
Take medication as prescribed. Rest. Drink plenty of fluids.  Use over-the-counter hydrocortisone cream.  Wash linens well.  Continue to monitor for trigger.  Follow up with your primary care physician this week as needed. Return to Urgent care for new or worsening concerns.

## 2018-03-07 NOTE — ED Provider Notes (Signed)
[facility] ____________________________________________  Time seen: Approximately 4:20 PM  I have reviewed the triage vital signs and the nursing notes.   HISTORY  Chief Complaint Insect Bite   HPI Diana Roberts is a 67 y.o. female presenting for evaluation of itchy insect bites.  States that she first noticed a few insect bites to her right foot on Tuesday morning.  Reports each morning since then she has woken up with different bites.  Reports some of the other areas have since improved but some has continued.  States bites are itchy.  Denies pain.  Denies any known wasp or insect sting.  Denies tick bite or tick attachment.  States only on arms and legs, but one to left breast.  States that she and her daughter were concerned about bedbugs and checked the bed and did not find any.  Unsure if her granddaughter who has recently been sleeping at her house has any similar.  States that the areas around the insect bite did get very red and she applied some topical Amish cream that helped.  Denies other aggravating or alleviating factors.  Reports otherwise feels well.  No edema.  Denies recent sickness. Denies recent antibiotic use.   Past Medical History:  Diagnosis Date  . Allergic state   . Anginal pain ([place])   . Anxiety   . Arthritis   . Chronic insomnia   . Colon adenomas   . De Quervain's disease (radial styloid tenosynovitis)   . Depression   . Diabetes mellitus without complication ([place])   . Headache    migraines  . History of hiatal hernia   . Hypercholesteremia   . Hyperkalemia   . Hyperlipidemia   . Hypertension   . Hypomagnesemia   . Obesity   . Seasonal allergies   . Sleep apnea    Report - Care everywhere - 06/09/2014  . TMJ (dislocation of temporomandibular joint)   . Vitamin B12 deficiency   . Vitamin D deficiency     Patient Active Problem List   Diagnosis Date Noted  . CAP (community acquired pneumonia) 06/27/2016    Past Surgical  History:  Procedure Laterality Date  . CARPAL TUNNEL RELEASE    . CATARACT EXTRACTION W/PHACO Left 02/08/2015   Procedure: CATARACT EXTRACTION PHACO AND INTRAOCULAR LENS PLACEMENT (IOC);  Surgeon: Leandrew Koyanagi, MD;  Location: [place];  Service: Ophthalmology;  Laterality: Left;  DIABETIC  . CESAREAN SECTION    . COLONOSCOPY    . COLONOSCOPY WITH PROPOFOL N/A 09/15/2017   Procedure: COLONOSCOPY WITH PROPOFOL;  Surgeon: Lollie Sails, MD;  Location: [facility] ENDOSCOPY;  Service: Endoscopy;  Laterality: N/A;  . COLONOSCOPY WITH PROPOFOL N/A 12/08/2017   Procedure: COLONOSCOPY WITH PROPOFOL;  Surgeon: Lollie Sails, MD;  Location: [facility] ENDOSCOPY;  Service: Endoscopy;  Laterality: N/A;  . HERNIA REPAIR    . KNEE ARTHROSCOPY       No current facility-administered medications for this encounter.   Current Outpatient Medications:  .  Acetaminophen (TYLENOL PO), Take 500 mg by mouth as needed., Disp: , Rfl:  .  albuterol (PROVENTIL HFA;VENTOLIN HFA) 108 (90 BASE) MCG/ACT inhaler, Inhale 2 puffs into the lungs every 6 (six) hours as needed for wheezing or shortness of breath., Disp: , Rfl:  .  aspirin EC 81 MG tablet, Take 81 mg by mouth daily., Disp: , Rfl:  .  buPROPion (WELLBUTRIN XL) 150 MG 24 hr tablet, Take 150 mg by mouth daily., Disp: , Rfl:  .  carvedilol (COREG) 6.25 MG tablet, Take 6.25 mg by mouth 2 (two) times daily with a meal., Disp: , Rfl:  .  Cholecalciferol 2000 UNITS CAPS, Take 2,000 Units by mouth daily. , Disp: , Rfl:  .  clonazePAM (KLONOPIN) 0.5 MG tablet, Take 0.5 mg by mouth 2 (two) times daily., Disp: , Rfl:  .  cyanocobalamin (,VITAMIN B-12,) 1000 MCG/ML injection, Inject 1,000 mcg into the muscle every 30 (thirty) days., Disp: , Rfl:  .  empagliflozin (JARDIANCE) 10 MG TABS tablet, Take 10 mg by mouth daily., Disp: , Rfl:  .  Ferrous Gluconate (IRON 27 PO), Take 1 tablet by mouth daily., Disp: , Rfl:  .  hydrochlorothiazide (HYDRODIURIL) 12.5 MG  tablet, Take 12.5 mg by mouth daily., Disp: , Rfl:  .  ibuprofen (ADVIL,MOTRIN) 800 MG tablet, Take 1 tablet by mouth every 6 (six) hours as needed., Disp: , Rfl:  .  insulin aspart (NOVOLOG) 100 UNIT/ML injection, Inject 10 Units into the skin 3 (three) times daily with meals., Disp: 10 mL, Rfl: 11 .  insulin glargine (LANTUS) 100 UNIT/ML injection, Inject 70 Units into the skin at bedtime. , Disp: , Rfl:  .  lisinopril (PRINIVIL,ZESTRIL) 40 MG tablet, Take 20 mg by mouth daily., Disp: , Rfl:  .  magnesium oxide (MAG-OX) 400 MG tablet, Take 400 mg by mouth 2 (two) times daily., Disp: , Rfl:  .  metFORMIN (GLUCOPHAGE) 1000 MG tablet, Take 1,000 mg by mouth daily with breakfast. , Disp: , Rfl:  .  Multiple Vitamin (MULTIVITAMIN) tablet, Take 1 tablet by mouth daily., Disp: , Rfl:  .  oxybutynin (DITROPAN-XL) 5 MG 24 hr tablet, Take 5 mg by mouth at bedtime., Disp: , Rfl:  .  sertraline (ZOLOFT) 100 MG tablet, Take 100 mg by mouth daily., Disp: , Rfl:  .  simvastatin (ZOCOR) 40 MG tablet, Take 40 mg by mouth at bedtime. , Disp: , Rfl:  .  cephALEXin (KEFLEX) 500 MG capsule, Take 1 capsule (500 mg total) by mouth 3 (three) times daily for 7 days., Disp: 21 capsule, Rfl: 0 .  hydrOXYzine (ATARAX/VISTARIL) 25 MG tablet, Take 1 tablet (25 mg total) by mouth 3 (three) times daily as needed for itching., Disp: 15 tablet, Rfl: 0 .  liraglutide 18 MG/3ML SOPN, Inject 1.8 mg into the skin daily., Disp: , Rfl:  .  omega-3 acid ethyl esters (LOVAZA) 1 G capsule, Take 1 g by mouth at bedtime., Disp: , Rfl:   Allergies Ciprofloxacin; Pseudoephedrine-guaifenesin; and Tylenol sinus severe [pseudoephed-apap-guaifenesin]  Family History  Problem Relation Age of Onset  . Cancer Mother   . Aneurysm Father   . Breast cancer Neg Hx     Social History Social History   Tobacco Use  . Smoking status: Never Smoker  . Smokeless tobacco: Never Used  Substance Use Topics  . Alcohol use: No  . Drug use: No     Review of Systems Constitutional: No fever/chills Cardiovascular: Denies chest pain. Respiratory: Denies shortness of breath. Gastrointestinal: No abdominal pain.   Musculoskeletal: Negative for back pain. Skin: AS above.   ____________________________________________   PHYSICAL EXAM:  VITAL SIGNS: ED Triage Vitals  Enc Vitals Group     BP 03/07/18 1505 122/66     Pulse Rate 03/07/18 1505 67     Resp 03/07/18 1505 16     Temp 03/07/18 1505 99.3 F (37.4 C)     Temp Source 03/07/18 1505 Oral     SpO2 03/07/18 1505 97 %  Weight 03/07/18 1506 165 lb (74.8 kg)     Height 03/07/18 1506 4\' 10"  (1.473 m)     Head Circumference --      Peak Flow --      Pain Score 03/07/18 1506 0     Pain Loc --      Pain Edu? --      Excl. in [place]? --     Constitutional: Alert and oriented. Well appearing and in no acute distress. ENT      Head: Normocephalic and atraumatic. Cardiovascular: Normal rate, regular rhythm. Grossly normal heart sounds.  Good peripheral circulation. Respiratory: Normal respiratory effort without tachypnea nor retractions. Breath sounds are clear and equal bilaterally. No wheezes, rales, rhonchi. Musculoskeletal:  Steady gait. Neurologic:  Normal speech and language. Speech is normal. No gait instability.  Skin:  Skin is warm, dry. Except: Right arm, bilateral lower legs and few to left upper arm scattered less than 1 cm minimally erythematous wheals with centered punctum, nonvesicular, no induration, nontender tender, no drainage, similar to dorsal left foot as well as right second dorsal toe with mild to moderate immediate erythema without drainage or induration or fluctuance. Psychiatric: Mood and affect are normal. Speech and behavior are normal. Patient exhibits appropriate insight and judgment   ___________________________________________   LABS (all labs ordered are listed, but only abnormal results are displayed)  Labs Reviewed - No data to  display ____________________________________________  PROCEDURES Procedures   INITIAL IMPRESSION / ASSESSMENT AND PLAN / ED COURSE  Pertinent labs & imaging results that were available during my care of the patient were reviewed by me and considered in my medical decision making (see chart for details).  Well-appearing patient.  No acute distress.  Skin changes appearance appear consistent with insect bite such as mosquito or bedbug.  Counseled regarding this.  Will treat with PRN hydroxyzine as needed, over-the-counter topical hydrocortisone.  As erythema noted to bites to feet, discussed concern is secondary infection.  Discussed as patient has been scratching these areas as well close monitoring if no improvement by tomorrow evening go ahead and start oral Keflex.Discussed indication, risks and benefits of medications with patient.  Discussed follow up with Primary care physician this week. Discussed follow up and return parameters including no resolution or any worsening concerns. Patient verbalized understanding and agreed to plan.   ____________________________________________   FINAL CLINICAL IMPRESSION(S) / ED DIAGNOSES  Final diagnoses:  Insect bite, unspecified site, initial encounter     ED Discharge Orders        Ordered    hydrOXYzine (ATARAX/VISTARIL) 25 MG tablet  3 times daily PRN     03/07/18 1554    cephALEXin (KEFLEX) 500 MG capsule  3 times daily     03/07/18 1554       Note: This dictation was prepared with Dragon dictation along with smaller phrase technology. Any transcriptional errors that result from this process are unintentional.         Marylene Land, NP 03/07/18 1649

## 2018-03-30 ENCOUNTER — Other Ambulatory Visit: Payer: Self-pay

## 2018-03-30 ENCOUNTER — Ambulatory Visit
Admission: EM | Admit: 2018-03-30 | Discharge: 2018-03-30 | Disposition: A | Discharge status: Home / Self Care | Payer: 59 | Attending: Internal Medicine | Admitting: Internal Medicine

## 2018-03-30 ENCOUNTER — Encounter: Payer: Self-pay | Admitting: Emergency Medicine

## 2018-03-30 DIAGNOSIS — S50862A Insect bite (nonvenomous) of left forearm, initial encounter: Secondary | ICD-10-CM | POA: Diagnosis not present

## 2018-03-30 DIAGNOSIS — S30860A Insect bite (nonvenomous) of lower back and pelvis, initial encounter: Secondary | ICD-10-CM | POA: Diagnosis not present

## 2018-03-30 DIAGNOSIS — W57XXXA Bitten or stung by nonvenomous insect and other nonvenomous arthropods, initial encounter: Secondary | ICD-10-CM

## 2018-03-30 DIAGNOSIS — S40861A Insect bite (nonvenomous) of right upper arm, initial encounter: Secondary | ICD-10-CM | POA: Diagnosis not present

## 2018-03-30 DIAGNOSIS — S90862A Insect bite (nonvenomous), left foot, initial encounter: Secondary | ICD-10-CM | POA: Diagnosis not present

## 2018-03-30 MED ORDER — HYDROXYZINE HCL 25 MG PO TABS: 25.0000 mg | ORAL_TABLET | Freq: Three times a day (TID) | ORAL | 0 refills | Status: AC | PRN

## 2018-03-30 NOTE — ED Provider Notes (Signed)
[facility] ____________________________________________  Time seen: Approximately 5:18 PM  I have reviewed the triage vital signs and the nursing notes.   HISTORY  Chief Complaint Insect Bite   HPI Diana Roberts is a 67 y.o. female presented for evaluation of insect bites.  Patient states she is recently seen in urgent care for concern of bedbugs, reports after last visit she had Terminex, and it was confirmed that she did have bedbugs in her house, particularly in her brother-in-law's room that lives with her.  States it was a large infestation in his room.  States they follow directions of Terminex and had the home treated.  States that she has been out of town this past week and just returned home yesterday.  States last night was the first night that she slept in her home again, and states that between 3-5 AM she was waking up with bite sensations.  States that she pulled up her pillow as well as in her clothing she found bugs consistent with bedbugs.  States scattered bites to left forearm, right arm, left foot and one on buttocks.  States bites are very itchy, denies pain.  Denies other accompanying symptoms.  No alleviating measures attempted prior to arrival.  Denies aggravating factors.  Reports otherwise feels well.  Reports does have triamcinolone cream at home left over from recent dermatology visit.  States previously prescribed hydroxyzine worked well for her itching, but does not have any left.   Past Medical History:  Diagnosis Date  . Allergic state   . Anginal pain ([place])   . Anxiety   . Arthritis   . Chronic insomnia   . Colon adenomas   . De Quervain's disease (radial styloid tenosynovitis)   . Depression   . Diabetes mellitus without complication ([place])   . Headache    migraines  . History of hiatal hernia   . Hypercholesteremia   . Hyperkalemia   . Hyperlipidemia   . Hypertension   . Hypomagnesemia   . Obesity   . Seasonal allergies   .  Sleep apnea    Report - Care everywhere - 06/09/2014  . TMJ (dislocation of temporomandibular joint)   . Vitamin B12 deficiency   . Vitamin D deficiency     Patient Active Problem List   Diagnosis Date Noted  . CAP (community acquired pneumonia) 06/27/2016    Past Surgical History:  Procedure Laterality Date  . CARPAL TUNNEL RELEASE    . CATARACT EXTRACTION W/PHACO Left 02/08/2015   Procedure: CATARACT EXTRACTION PHACO AND INTRAOCULAR LENS PLACEMENT (IOC);  Surgeon: Leandrew Koyanagi, MD;  Location: [place];  Service: Ophthalmology;  Laterality: Left;  DIABETIC  . CESAREAN SECTION    . COLONOSCOPY    . COLONOSCOPY WITH PROPOFOL N/A 09/15/2017   Procedure: COLONOSCOPY WITH PROPOFOL;  Surgeon: Lollie Sails, MD;  Location[facility] ENDOSCOPY;  Service: Endoscopy;  Laterality: N/A;  . COLONOSCOPY WITH PROPOFOL N/A 12/08/2017   Procedure: COLONOSCOPY WITH PROPOFOL;  Surgeon: Lollie Sails, MD;  Location: [facility] ENDOSCOPY;  Service: Endoscopy;  Laterality: N/A;  . HERNIA REPAIR    . KNEE ARTHROSCOPY       No current facility-administered medications for this encounter.   Current Outpatient Medications:  .  aspirin EC 81 MG tablet, Take 81 mg by mouth daily., Disp: , Rfl:  .  buPROPion (WELLBUTRIN XL) 150 MG 24 hr tablet, Take 150 mg by mouth daily., Disp: , Rfl:  .  carvedilol (COREG) 6.25 MG tablet,  Take 6.25 mg by mouth 2 (two) times daily with a meal., Disp: , Rfl:  .  clonazePAM (KLONOPIN) 0.5 MG tablet, Take 0.5 mg by mouth 2 (two) times daily., Disp: , Rfl:  .  cyanocobalamin (,VITAMIN B-12,) 1000 MCG/ML injection, Inject 1,000 mcg into the muscle every 30 (thirty) days., Disp: , Rfl:  .  empagliflozin (JARDIANCE) 10 MG TABS tablet, Take 10 mg by mouth daily., Disp: , Rfl:  .  Ferrous Gluconate (IRON 27 PO), Take 1 tablet by mouth daily., Disp: , Rfl:  .  hydrochlorothiazide (HYDRODIURIL) 12.5 MG tablet, Take 12.5 mg by mouth daily., Disp: , Rfl:  .  hydrOXYzine  (ATARAX/VISTARIL) 25 MG tablet, Take 1 tablet (25 mg total) by mouth 3 (three) times daily as needed for itching., Disp: 15 tablet, Rfl: 0 .  insulin aspart (NOVOLOG) 100 UNIT/ML injection, Inject 10 Units into the skin 3 (three) times daily with meals., Disp: 10 mL, Rfl: 11 .  insulin glargine (LANTUS) 100 UNIT/ML injection, Inject 70 Units into the skin at bedtime. , Disp: , Rfl:  .  lisinopril (PRINIVIL,ZESTRIL) 40 MG tablet, Take 20 mg by mouth daily., Disp: , Rfl:  .  magnesium oxide (MAG-OX) 400 MG tablet, Take 400 mg by mouth 2 (two) times daily., Disp: , Rfl:  .  metFORMIN (GLUCOPHAGE) 1000 MG tablet, Take 1,000 mg by mouth daily with breakfast. , Disp: , Rfl:  .  Multiple Vitamin (MULTIVITAMIN) tablet, Take 1 tablet by mouth daily., Disp: , Rfl:  .  oxybutynin (DITROPAN-XL) 5 MG 24 hr tablet, Take 5 mg by mouth at bedtime., Disp: , Rfl:  .  sertraline (ZOLOFT) 100 MG tablet, Take 100 mg by mouth daily., Disp: , Rfl:  .  simvastatin (ZOCOR) 40 MG tablet, Take 40 mg by mouth at bedtime. , Disp: , Rfl:  .  Acetaminophen (TYLENOL PO), Take 500 mg by mouth as needed., Disp: , Rfl:  .  albuterol (PROVENTIL HFA;VENTOLIN HFA) 108 (90 BASE) MCG/ACT inhaler, Inhale 2 puffs into the lungs every 6 (six) hours as needed for wheezing or shortness of breath., Disp: , Rfl:  .  Cholecalciferol 2000 UNITS CAPS, Take 2,000 Units by mouth daily. , Disp: , Rfl:  .  hydrOXYzine (ATARAX/VISTARIL) 25 MG tablet, Take 1 tablet (25 mg total) by mouth 3 (three) times daily as needed for itching., Disp: 20 tablet, Rfl: 0 .  ibuprofen (ADVIL,MOTRIN) 800 MG tablet, Take 1 tablet by mouth every 6 (six) hours as needed., Disp: , Rfl:  .  liraglutide 18 MG/3ML SOPN, Inject 1.8 mg into the skin daily., Disp: , Rfl:  .  omega-3 acid ethyl esters (LOVAZA) 1 G capsule, Take 1 g by mouth at bedtime., Disp: , Rfl:   Allergies Ciprofloxacin; Pseudoephedrine-guaifenesin; and Tylenol sinus severe  [pseudoephed-apap-guaifenesin]  Family History  Problem Relation Age of Onset  . Cancer Mother   . Aneurysm Father   . Breast cancer Neg Hx     Social History Social History   Tobacco Use  . Smoking status: Never Smoker  . Smokeless tobacco: Never Used  Substance Use Topics  . Alcohol use: No  . Drug use: No    Review of Systems Constitutional: No fever/chills Cardiovascular: Denies chest pain. Respiratory: Denies shortness of breath. Gastrointestinal: No abdominal pain.   Musculoskeletal: Negative for back pain. Skin: As above.   ____________________________________________   PHYSICAL EXAM:  VITAL SIGNS: ED Triage Vitals  Enc Vitals Group     BP 03/30/18 1616 (!) 119/100  Pulse Rate 03/30/18 1616 67     Resp 03/30/18 1616 16     Temp 03/30/18 1616 98.2 F (36.8 C)     Temp Source 03/30/18 1616 Oral     SpO2 03/30/18 1616 97 %     Weight 03/30/18 1612 165 lb (74.8 kg)     Height 03/30/18 1612 4\' 10"  (1.473 m)     Head Circumference --      Peak Flow --      Pain Score 03/30/18 1612 6     Pain Loc --      Pain Edu? --      Excl. in [place]? --     Constitutional: Alert and oriented. Well appearing and in no acute distress. ENT      Head: Normocephalic and atraumatic. Cardiovascular: Normal rate, regular rhythm. Grossly normal heart sounds.  Good peripheral circulation. Respiratory: Normal respiratory effort without tachypnea nor retractions. Breath sounds are clear and equal bilaterally. No wheezes, rales, rhonchi. Musculoskeletal: Steady gait. No extremity tenderness noted.  Neurologic:  Normal speech and language. Speech is normal. No gait instability.  Skin:  Skin is warm, dry.  Except: Pruritic erythematous papules with centered punctum is present in clusters to right upper arm, right and left forearms, and dorsal left foot, no surrounding erythema, nontender, no drainage.  Patient declined evaluation of buttocks bite. Psychiatric: Mood and affect are  normal. Speech and behavior are normal. Patient exhibits appropriate insight and judgment   ___________________________________________   LABS (all labs ordered are listed, but only abnormal results are displayed)  Labs Reviewed - No data to display   PROCEDURES Procedures   INITIAL IMPRESSION / ASSESSMENT AND PLAN / ED COURSE  Pertinent labs & imaging results that were available during my care of the patient were reviewed by me and considered in my medical decision making (see chart for details).  Appearing patient.  No acute distress.  Daughter at bedside.  Subjective report and clinical appearance consistent with bedbug bites.  Discussed use of triamcinolone cream as needed.  Will Rx hydroxyzine.  Discussed supportive care, avoidance of scratching, cool compresses, and to continue to work to solve issue.Discussed indication, risks and benefits of medications with patient.   Discussed follow up with Primary care physician this week. Discussed follow up and return parameters including no resolution or any worsening concerns. Patient verbalized understanding and agreed to plan.   ____________________________________________   FINAL CLINICAL IMPRESSION(S) / ED DIAGNOSES  Final diagnoses:  Bedbug bite, initial encounter     ED Discharge Orders         Ordered    hydrOXYzine (ATARAX/VISTARIL) 25 MG tablet  3 times daily PRN     03/30/18 1712           Note: This dictation was prepared with Dragon dictation along with smaller phrase technology. Any transcriptional errors that result from this process are unintentional.         Marylene Land, NP 03/30/18 1725

## 2018-03-30 NOTE — Discharge Instructions (Signed)
Take medication as prescribed.Avoid scratching.   Follow up with your primary care physician this week as needed. Return to Urgent care for new or worsening concerns.

## 2018-03-30 NOTE — ED Triage Notes (Signed)
Patient states that she has had bed bugs in her house before.  Patient states that she woke up this morning and bumps on her arms.

## 2018-10-16 ENCOUNTER — Other Ambulatory Visit: Payer: Self-pay | Admitting: Family Medicine

## 2018-10-16 DIAGNOSIS — Z1231 Encounter for screening mammogram for malignant neoplasm of breast: Secondary | ICD-10-CM

## 2018-10-23 ENCOUNTER — Other Ambulatory Visit: Payer: Self-pay

## 2018-10-23 ENCOUNTER — Ambulatory Visit
Admission: RE | Admit: 2018-10-23 | Discharge: 2018-10-23 | Disposition: A | Discharge status: Home / Self Care | Payer: Medicare HMO | Source: Ambulatory Visit | Attending: Family Medicine | Admitting: Family Medicine

## 2018-10-23 DIAGNOSIS — Z1231 Encounter for screening mammogram for malignant neoplasm of breast: Secondary | ICD-10-CM | POA: Diagnosis not present

## 2019-08-19 ENCOUNTER — Other Ambulatory Visit: Payer: Self-pay | Admitting: Hematology and Oncology

## 2019-08-19 ENCOUNTER — Other Ambulatory Visit: Payer: Self-pay | Admitting: Family Medicine

## 2019-08-19 DIAGNOSIS — Z780 Asymptomatic menopausal state: Secondary | ICD-10-CM

## 2019-08-19 DIAGNOSIS — Z1231 Encounter for screening mammogram for malignant neoplasm of breast: Secondary | ICD-10-CM

## 2019-08-19 DIAGNOSIS — Z13820 Encounter for screening for osteoporosis: Secondary | ICD-10-CM

## 2019-09-01 ENCOUNTER — Ambulatory Visit: Payer: 59 | Attending: Internal Medicine

## 2019-09-01 DIAGNOSIS — Z20822 Contact with and (suspected) exposure to covid-19: Secondary | ICD-10-CM

## 2019-09-02 LAB — NOVEL CORONAVIRUS, NAA: SARS-CoV-2, NAA: NOT DETECTED

## 2019-10-25 ENCOUNTER — Other Ambulatory Visit: Payer: Self-pay

## 2019-10-25 ENCOUNTER — Ambulatory Visit
Admission: RE | Admit: 2019-10-25 | Discharge: 2019-10-25 | Disposition: A | Discharge status: Home / Self Care | Payer: Medicare HMO | Source: Ambulatory Visit | Attending: Hematology and Oncology | Admitting: Hematology and Oncology

## 2019-10-25 ENCOUNTER — Ambulatory Visit
Admission: RE | Admit: 2019-10-25 | Discharge: 2019-10-25 | Disposition: A | Discharge status: Home / Self Care | Payer: Medicare HMO | Source: Ambulatory Visit | Attending: Family Medicine | Admitting: Family Medicine

## 2019-10-25 DIAGNOSIS — Z1231 Encounter for screening mammogram for malignant neoplasm of breast: Secondary | ICD-10-CM | POA: Diagnosis not present

## 2019-10-25 DIAGNOSIS — Z780 Asymptomatic menopausal state: Secondary | ICD-10-CM | POA: Insufficient documentation

## 2019-10-25 DIAGNOSIS — Z13820 Encounter for screening for osteoporosis: Secondary | ICD-10-CM | POA: Insufficient documentation

## 2019-10-27 DIAGNOSIS — M85852 Other specified disorders of bone density and structure, left thigh: Secondary | ICD-10-CM | POA: Insufficient documentation

## 2020-11-03 ENCOUNTER — Ambulatory Visit
Admission: EM | Admit: 2020-11-03 | Discharge: 2020-11-03 | Disposition: A | Discharge status: Home / Self Care | Payer: Medicare HMO | Attending: Family Medicine | Admitting: Family Medicine

## 2020-11-03 ENCOUNTER — Other Ambulatory Visit: Payer: Self-pay

## 2020-11-03 ENCOUNTER — Ambulatory Visit (INDEPENDENT_AMBULATORY_CARE_PROVIDER_SITE_OTHER): Payer: Medicare HMO

## 2020-11-03 ENCOUNTER — Encounter: Payer: Self-pay | Admitting: Emergency Medicine

## 2020-11-03 DIAGNOSIS — M12811 Other specific arthropathies, not elsewhere classified, right shoulder: Secondary | ICD-10-CM

## 2020-11-03 DIAGNOSIS — M19019 Primary osteoarthritis, unspecified shoulder: Secondary | ICD-10-CM

## 2020-11-03 DIAGNOSIS — M13811 Other specified arthritis, right shoulder: Secondary | ICD-10-CM

## 2020-11-03 DIAGNOSIS — M19011 Primary osteoarthritis, right shoulder: Secondary | ICD-10-CM | POA: Diagnosis not present

## 2020-11-03 MED ORDER — MELOXICAM 15 MG PO TABS: 7.5000 mg | ORAL_TABLET | Freq: Every day | ORAL | 0 refills | Status: AC | PRN

## 2020-11-03 MED ORDER — HYDROCODONE-ACETAMINOPHEN 5-325 MG PO TABS: 1.0000 | ORAL_TABLET | Freq: Three times a day (TID) | ORAL | 0 refills | Status: AC | PRN

## 2020-11-03 NOTE — ED Provider Notes (Signed)
[facility]    CSN: [id number] Arrival date & time: 11/03/20  1405      History   Chief Complaint Chief Complaint  Patient presents with  . Shoulder Pain    right   HPI  70 year old female presents with right shoulder pain.  Patient reports that she had ongoing right shoulder pain for the past year.  Has been worsening over the past week.  She has been painting and washing walls.  This is likely contributed to her pain.  She reports difficulty with range of motion and difficulty lifting.  She localizes the pain to the right shoulder.  She states that it radiates down to the elbow.  She also reports right-sided neck pain.  No paresthesias or numbness in the hand.  Pain 7/10 in severity.  No relieving factors.  No other complaints.  Past Medical History:  Diagnosis Date  . Allergic state   . Anginal pain ([place])   . Anxiety   . Arthritis   . Chronic insomnia   . Colon adenomas   . De Quervain's disease (radial styloid tenosynovitis)   . Depression   . Diabetes mellitus without complication ([place])   . Headache    migraines  . History of hiatal hernia   . Hypercholesteremia   . Hyperkalemia   . Hyperlipidemia   . Hypertension   . Hypomagnesemia   . Obesity   . Seasonal allergies   . Sleep apnea    Report - Care everywhere - 06/09/2014  . TMJ (dislocation of temporomandibular joint)   . Vitamin B12 deficiency   . Vitamin D deficiency     Patient Active Problem List   Diagnosis Date Noted  . CAP (community acquired pneumonia) 06/27/2016    Past Surgical History:  Procedure Laterality Date  . CARPAL TUNNEL RELEASE    . CATARACT EXTRACTION W/PHACO Left 02/08/2015   Procedure: CATARACT EXTRACTION PHACO AND INTRAOCULAR LENS PLACEMENT (IOC);  Surgeon: Leandrew Koyanagi, MD;  Location: [facility];  Service: Ophthalmology;  Laterality: Left;  DIABETIC  . CESAREAN SECTION    . COLONOSCOPY    . COLONOSCOPY WITH PROPOFOL N/A 09/15/2017   Procedure:  COLONOSCOPY WITH PROPOFOL;  Surgeon: Lollie Sails, MD;  Location: [facility] ENDOSCOPY;  Service: Endoscopy;  Laterality: N/A;  . COLONOSCOPY WITH PROPOFOL N/A 12/08/2017   Procedure: COLONOSCOPY WITH PROPOFOL;  Surgeon: Lollie Sails, MD;  Location: [facility] ENDOSCOPY;  Service: Endoscopy;  Laterality: N/A;  . HERNIA REPAIR    . KNEE ARTHROSCOPY      OB History   No obstetric history on file.      Home Medications    Prior to Admission medications   Medication Sig Start Date End Date Taking? Authorizing Provider  aspirin EC 81 MG tablet Take 81 mg by mouth daily.   Yes [provider]  buPROPion (WELLBUTRIN XL) 150 MG 24 hr tablet Take 150 mg by mouth daily.   Yes [provider]  carvedilol (COREG) 6.25 MG tablet Take 6.25 mg by mouth 2 (two) times daily with a meal.   Yes [provider]  Cholecalciferol 2000 UNITS CAPS Take 2,000 Units by mouth daily.    Yes [provider]  clonazePAM (KLONOPIN) 0.5 MG tablet Take 0.5 mg by mouth 2 (two) times daily.   Yes [provider]  Dulaglutide (TRULICITY) 0.35 KK/9.3GH SOPN Inject into the skin. 12/23/19  Yes [provider]  empagliflozin (JARDIANCE) 10 MG TABS tablet Take 10 mg by mouth  daily. 09/13/16  Yes [provider]  Ferrous Gluconate (IRON 27 PO) Take 1 tablet by mouth daily.   Yes [provider]  hydrochlorothiazide (HYDRODIURIL) 12.5 MG tablet Take 12.5 mg by mouth daily.   Yes [provider]  HYDROcodone-acetaminophen (NORCO/VICODIN) 5-325 MG tablet Take 1 tablet by mouth every 8 (eight) hours as needed for moderate pain or severe pain. 11/03/20  Yes Nour Scalise G, DO  lisinopril (PRINIVIL,ZESTRIL) 40 MG tablet Take 20 mg by mouth daily.   Yes [provider]  magnesium oxide (MAG-OX) 400 MG tablet Take 400 mg by mouth 2 (two) times daily.   Yes [provider]  meloxicam (MOBIC) 15 MG tablet Take 0.5-1 tablets (7.5-15 mg total) by  mouth daily as needed. 11/03/20  Yes Celester Lech G, DO  metFORMIN (GLUCOPHAGE) 1000 MG tablet Take 1,000 mg by mouth daily with breakfast.    Yes [provider]  Multiple Vitamin (MULTIVITAMIN) tablet Take 1 tablet by mouth daily.   Yes [provider]  omega-3 acid ethyl esters (LOVAZA) 1 G capsule Take 1 g by mouth at bedtime.   Yes [provider]  oxybutynin (DITROPAN-XL) 5 MG 24 hr tablet Take 5 mg by mouth at bedtime.   Yes [provider]  sertraline (ZOLOFT) 100 MG tablet Take 100 mg by mouth daily.   Yes [provider]  simvastatin (ZOCOR) 40 MG tablet Take 40 mg by mouth at bedtime.    Yes [provider]  Acetaminophen (TYLENOL PO) Take 500 mg by mouth as needed.    [provider]  albuterol (PROVENTIL HFA;VENTOLIN HFA) 108 (90 BASE) MCG/ACT inhaler Inhale 2 puffs into the lungs every 6 (six) hours as needed for wheezing or shortness of breath.    [provider]  hydrOXYzine (ATARAX/VISTARIL) 25 MG tablet Take 1 tablet (25 mg total) by mouth 3 (three) times daily as needed for itching. 03/30/18   Marylene Land, NP  insulin aspart (NOVOLOG) 100 UNIT/ML injection Inject 10 Units into the skin 3 (three) times daily with meals. 06/28/16   Gladstone Lighter, MD  insulin glargine (LANTUS) 100 UNIT/ML injection Inject 70 Units into the skin at bedtime.     [provider]  traZODone (DESYREL) 50 MG tablet Take 50 mg by mouth at bedtime as needed. 09/19/20   [provider]  liraglutide 18 MG/3ML SOPN Inject 1.8 mg into the skin daily. 09/13/16 11/03/20  [provider]    Family History Family History  Problem Relation Age of Onset  . Cancer Mother   . Aneurysm Father   . Breast cancer Neg Hx     Social History Social History   Tobacco Use  . Smoking status: Never Smoker  . Smokeless tobacco: Never Used  Vaping Use  . Vaping Use: Never used  Substance Use Topics  . Alcohol use: No   . Drug use: No     Allergies   Ciprofloxacin, Pseudoephedrine-guaifenesin, and Tylenol sinus severe [pseudoephed-apap-guaifenesin]   Review of Systems Review of Systems  Constitutional: Negative.   Musculoskeletal:       Neck pain, shoulder pain.   Physical Exam Triage Vital Signs ED Triage Vitals  Enc Vitals Group     BP 11/03/20 1522 (!) 149/59     Pulse Rate 11/03/20 1522 60     Resp 11/03/20 1522 14     Temp 11/03/20 1522 98.5 F (36.9 C)     Temp Source 11/03/20 1522 Oral  SpO2 11/03/20 1522 97 %     Weight 11/03/20 1517 170 lb (77.1 kg)     Height 11/03/20 1517 4\' 10"  (1.473 m)     Head Circumference --      Peak Flow --      Pain Score 11/03/20 1517 7     Pain Loc --      Pain Edu? --      Excl. in [place]? --    Updated Vital Signs BP (!) 149/59 (BP Location: Left Arm)   Pulse 60   Temp 98.5 F (36.9 C) (Oral)   Resp 14   Ht 4\' 10"  (1.473 m)   Wt 77.1 kg   SpO2 97%   BMI 35.53 kg/m   Visual Acuity Right Eye Distance:   Left Eye Distance:   Bilateral Distance:    Right Eye Near:   Left Eye Near:    Bilateral Near:     Physical Exam Vitals and nursing note reviewed.  Constitutional:      General: She is not in acute distress.    Appearance: Normal appearance. She is obese. She is not ill-appearing.  HENT:     Head: Normocephalic and atraumatic.  Eyes:     General:        Right eye: No discharge.        Left eye: No discharge.     Conjunctiva/sclera: Conjunctivae normal.  Pulmonary:     Effort: Pulmonary effort is normal. No respiratory distress.  Musculoskeletal:     Comments: Right shoulder -tenderness over the bicipital groove.  Right trapezius tenderness to palpation.  Decreased range of motion of the right shoulder in flexion.  Positive Hawkins.  4/5 strength in the supraspinatus and infraspinatus/teres minor.  Neurological:     Mental Status: She is alert.  Psychiatric:        Mood and Affect: Mood normal.        Behavior: Behavior  normal.    UC Treatments / Results  Labs (all labs ordered are listed, but only abnormal results are displayed) Labs Reviewed - No data to display  EKG   Radiology DG Cervical Spine Complete  Result Date: 11/03/2020 CLINICAL DATA:  Shoulder pain. EXAM: CERVICAL SPINE - COMPLETE 4+ VIEW COMPARISON:  None. FINDINGS: Cervical spine alignment is maintained. Vertebral body heights and intervertebral disc spaces are preserved. The dens is intact. Posterior elements appear well-aligned. There is no evidence of fracture. No significant bony neural foraminal narrowing. No prevertebral soft tissue edema. IMPRESSION: Negative cervical spine radiographs. Electronically Signed   By: Keith Rake M.D.   On: 11/03/2020 16:20   DG Shoulder Right  Result Date: 11/03/2020 CLINICAL DATA:  Right shoulder pain.  Pain for 1 year, progressive. EXAM: RIGHT SHOULDER - 2+ VIEW COMPARISON:  None. FINDINGS: There is no evidence of fracture or dislocation. Minimal acromioclavicular and glenohumeral spurring. No evidence of a vascular necrosis, focal bone lesion or bone destruction. Soft tissues are unremarkable. IMPRESSION: Minimal acromioclavicular and glenohumeral osteoarthritis. Electronically Signed   By: Keith Rake M.D.   On: 11/03/2020 16:19    Procedures Procedures (including critical care time)  Medications Ordered in UC Medications - No data to display  Initial Impression / Assessment and Plan / UC Course  I have reviewed the triage vital signs and the nursing notes.  Pertinent labs & imaging results that were available during my care of the patient were reviewed by me and considered in my medical decision making (see chart for  details).    70 year old female presents with right shoulder pain.  Also having right-sided neck pain.  However, no numbness/paresthesia.  X-rays were obtained of the cervical spine as well as the right shoulder.  X-rays were independently reviewed.  Interpretation:  Cervical spine films were negative.  X-ray of the right shoulder revealed AC joint arthritis as well as glenohumeral arthritis.  I suspect that patient has rotator cuff arthropathy in addition to her arthritic findings.  Meloxicam as directed.  As needed Vicodin for severe pain.  Information given regarding local orthopedics.  Final Clinical Impressions(s) / UC Diagnoses   Final diagnoses:  Rotator cuff arthropathy of right shoulder  Glenohumeral arthritis  Arthritis of right acromioclavicular joint     Discharge Instructions     Medication as prescribed.  Please call [facility] [phone]) OR [facility] [phone]) for an appt.  Take care  Dr. Lacinda Axon    ED Prescriptions    Medication Sig Dispense Auth. Provider   meloxicam (MOBIC) 15 MG tablet Take 0.5-1 tablets (7.5-15 mg total) by mouth daily as needed. 30 tablet Loleta Frommelt G, DO   HYDROcodone-acetaminophen (NORCO/VICODIN) 5-325 MG tablet Take 1 tablet by mouth every 8 (eight) hours as needed for moderate pain or severe pain. 10 tablet Thersa Salt G, DO     I have reviewed the PDMP during this encounter.   Coral Spikes, [place] 11/03/20 1816

## 2020-11-03 NOTE — ED Triage Notes (Signed)
Patient c/o right shoulder and upper arm pain that started a year ago.  Patient reports pain that goes into her neck that started 2 weeks ago.  Patient states that the pain has gotten worse and is unable to pick up anything with her right arm.

## 2020-11-03 NOTE — Discharge Instructions (Signed)
Medication as prescribed.  Please call [facility] [phone]) OR [facility] [phone]) for an appt.  Take care  Dr. Lacinda Axon

## 2021-08-21 ENCOUNTER — Other Ambulatory Visit: Payer: Self-pay

## 2021-08-21 ENCOUNTER — Ambulatory Visit
Admission: EM | Admit: 2021-08-21 | Discharge: 2021-08-21 | Disposition: A | Discharge status: Home / Self Care | Payer: Medicare HMO | Attending: Emergency Medicine | Admitting: Emergency Medicine

## 2021-08-21 ENCOUNTER — Encounter: Payer: Self-pay | Admitting: Emergency Medicine

## 2021-08-21 DIAGNOSIS — R058 Other specified cough: Secondary | ICD-10-CM

## 2021-08-21 DIAGNOSIS — B349 Viral infection, unspecified: Secondary | ICD-10-CM | POA: Diagnosis not present

## 2021-08-21 MED ORDER — BENZONATATE 100 MG PO CAPS: 100.0000 mg | ORAL_CAPSULE | Freq: Three times a day (TID) | ORAL | 0 refills | Status: AC | PRN

## 2021-08-21 NOTE — Discharge Instructions (Addendum)
Your COVID and Flu tests are pending.  You should self quarantine until the test results are back.    Take the [facility] as needed for cough.  Take Tylenol as needed for fever or discomfort.  Rest and keep yourself hydrated.    Follow-up with your primary care provider if your symptoms are not improving.

## 2021-08-21 NOTE — ED Provider Notes (Signed)
[name]    CSN: [id number] Arrival date & time: 08/21/21  1258      History   Chief Complaint Chief Complaint  Patient presents with   Headache   Nasal Congestion   Sore Throat   Fatigue    HPI Diana Roberts is a 71 y.o. female.  Patient presents with 3-day history of cough productive of clear sputum, congestion, runny nose, sinus pressure, headache, and diarrhea.  One episode of diarrhea today.  She denies fever, chills, rash, shortness of breath, vomiting, or other symptoms.  Negative COVID test at home last night.  Treatment at home with [facility].  her medical history includes viral pneumonia in 09/13/2020 due to COVID, hypertension, diabetes, seasonal allergies, obesity.  The history is provided by the patient and medical records.   Past Medical History:  Diagnosis Date   Allergic state    Anginal pain ([place])    Anxiety    Arthritis    Chronic insomnia    Colon adenomas    De Quervain's disease (radial styloid tenosynovitis)    Depression    Diabetes mellitus without complication ([facility])    Headache    migraines   History of hiatal hernia    Hypercholesteremia    Hyperkalemia    Hyperlipidemia    Hypertension    Hypomagnesemia    Obesity    Seasonal allergies    Sleep apnea    Report - Care everywhere - 06/09/2014   TMJ (dislocation of temporomandibular joint)    Vitamin B12 deficiency    Vitamin D deficiency     Patient Active Problem List   Diagnosis Date Noted   CAP (community acquired pneumonia) 06/27/2016    Past Surgical History:  Procedure Laterality Date   CARPAL TUNNEL RELEASE     CATARACT EXTRACTION W/PHACO Left 02/08/2015   Procedure: CATARACT EXTRACTION PHACO AND INTRAOCULAR LENS PLACEMENT ([place]);  Surgeon: Leandrew Koyanagi, MD;  Location: [facility];  Service: Ophthalmology;  Laterality: Left;  DIABETIC   CESAREAN SECTION     COLONOSCOPY     COLONOSCOPY WITH PROPOFOL N/A 09/15/2017   Procedure: COLONOSCOPY WITH  PROPOFOL;  Surgeon: Lollie Sails, MD;  Location: [facility] ENDOSCOPY;  Service: Endoscopy;  Laterality: N/A;   COLONOSCOPY WITH PROPOFOL N/A 12/08/2017   Procedure: COLONOSCOPY WITH PROPOFOL;  Surgeon: Lollie Sails, MD;  Location: [facility] ENDOSCOPY;  Service: Endoscopy;  Laterality: N/A;   HERNIA REPAIR     KNEE ARTHROSCOPY      OB History   No obstetric history on file.      Home Medications    Prior to Admission medications   Medication Sig Start Date End Date Taking? Authorizing Provider  benzonatate (TESSALON) 100 MG capsule Take 1 capsule (100 mg total) by mouth 3 (three) times daily as needed for cough. 08/21/21  Yes Diana Balloon, NP  Acetaminophen (TYLENOL PO) Take 500 mg by mouth as needed.    [provider]  albuterol (PROVENTIL HFA;VENTOLIN HFA) 108 (90 BASE) MCG/ACT inhaler Inhale 2 puffs into the lungs every 6 (six) hours as needed for wheezing or shortness of breath.    [provider]  aspirin EC 81 MG tablet Take 81 mg by mouth daily.    [provider]  buPROPion (WELLBUTRIN XL) 150 MG 24 hr tablet Take 150 mg by mouth daily.    [provider]  carvedilol (COREG) 6.25 MG tablet Take 6.25 mg by mouth 2 (two) times daily with a meal.  [provider]  Cholecalciferol 2000 UNITS CAPS Take 2,000 Units by mouth daily.     [provider]  clonazePAM (KLONOPIN) 0.5 MG tablet Take 0.5 mg by mouth 2 (two) times daily.    [provider]  Dulaglutide (TRULICITY) 1.74 YC/1.4GY SOPN Inject into the skin. 12/23/19   [provider]  empagliflozin (JARDIANCE) 10 MG TABS tablet Take 10 mg by mouth daily. 09/13/16   [provider]  Ferrous Gluconate (IRON 27 PO) Take 1 tablet by mouth daily.    [provider]  hydrochlorothiazide (HYDRODIURIL) 12.5 MG tablet Take 12.5 mg by mouth daily.    [provider]  HYDROcodone-acetaminophen (NORCO/VICODIN) 5-325 MG tablet Take 1 tablet by  mouth every 8 (eight) hours as needed for moderate pain or severe pain. 11/03/20   Coral Spikes, DO  hydrOXYzine (ATARAX/VISTARIL) 25 MG tablet Take 1 tablet (25 mg total) by mouth 3 (three) times daily as needed for itching. 03/30/18   Marylene Land, NP  insulin aspart (NOVOLOG) 100 UNIT/ML injection Inject 10 Units into the skin 3 (three) times daily with meals. 06/28/16   Gladstone Lighter, MD  insulin glargine (LANTUS) 100 UNIT/ML injection Inject 70 Units into the skin at bedtime.     [provider]  lisinopril (PRINIVIL,ZESTRIL) 40 MG tablet Take 20 mg by mouth daily.    [provider]  magnesium oxide (MAG-OX) 400 MG tablet Take 400 mg by mouth 2 (two) times daily.    [provider]  meloxicam (MOBIC) 15 MG tablet Take 0.5-1 tablets (7.5-15 mg total) by mouth daily as needed. 11/03/20   Coral Spikes, DO  metFORMIN (GLUCOPHAGE) 1000 MG tablet Take 1,000 mg by mouth daily with breakfast.     [provider]  Multiple Vitamin (MULTIVITAMIN) tablet Take 1 tablet by mouth daily.    [provider]  omega-3 acid ethyl esters (LOVAZA) 1 G capsule Take 1 g by mouth at bedtime.    [provider]  oxybutynin (DITROPAN-XL) 5 MG 24 hr tablet Take 5 mg by mouth at bedtime.    [provider]  sertraline (ZOLOFT) 100 MG tablet Take 100 mg by mouth daily.    [provider]  simvastatin (ZOCOR) 40 MG tablet Take 40 mg by mouth at bedtime.     [provider]  traZODone (DESYREL) 50 MG tablet Take 50 mg by mouth at bedtime as needed. 09/19/20   [provider]  liraglutide 18 MG/3ML SOPN Inject 1.8 mg into the skin daily. 09/13/16 11/03/20  [provider]    Family History Family History  Problem Relation Age of Onset   Cancer Mother    Aneurysm Father    Breast cancer Neg Hx     Social History Social History   Tobacco Use   Smoking status: Never   Smokeless tobacco: Never  Vaping Use   Vaping  Use: Never used  Substance Use Topics   Alcohol use: No   Drug use: No     Allergies   Ciprofloxacin, Pseudoephedrine-guaifenesin, and Tylenol sinus severe [pseudoephed-apap-guaifenesin]   Review of Systems Review of Systems  Constitutional:  Negative for chills and fever.  HENT:  Positive for congestion, rhinorrhea and sinus pressure. Negative for ear pain and sore throat.   Respiratory:  Positive for cough. Negative for shortness of breath.   Cardiovascular:  Negative for chest pain and palpitations.  Gastrointestinal:  Positive for diarrhea. Negative for abdominal pain and vomiting.  Skin:  Negative for color change and rash.  Neurological:  Positive for headaches.  All other systems reviewed and are negative.   Physical Exam Triage Vital Signs ED Triage Vitals  Enc Vitals Group     BP      Pulse      Resp      Temp      Temp src      SpO2      Weight      Height      Head Circumference      Peak Flow      Pain Score      Pain Loc      Pain Edu?      Excl. in [place]?    No data found.  Updated Vital Signs BP (!) 152/76 (BP Location: Left Arm)    Pulse 68    Temp 98.4 F (36.9 C) (Oral)    Resp 18    SpO2 95%   Visual Acuity Right Eye Distance:   Left Eye Distance:   Bilateral Distance:    Right Eye Near:   Left Eye Near:    Bilateral Near:     Physical Exam Vitals and nursing note reviewed.  Constitutional:      General: She is not in acute distress.    Appearance: She is well-developed. She is obese. She is not ill-appearing.  HENT:     Right Ear: Tympanic membrane normal.     Left Ear: Tympanic membrane normal.     Nose: Rhinorrhea present.     Mouth/Throat:     Mouth: Mucous membranes are moist.     Pharynx: Oropharynx is clear.  Cardiovascular:     Rate and Rhythm: Normal rate and regular rhythm.     Heart sounds: Normal heart sounds.  Pulmonary:     Effort: Pulmonary effort is normal. No respiratory distress.     Breath sounds: Normal  breath sounds.  Musculoskeletal:     Cervical back: Neck supple.  Skin:    General: Skin is warm and dry.  Neurological:     Mental Status: She is alert.  Psychiatric:        Mood and Affect: Mood normal.        Behavior: Behavior normal.     UC Treatments / Results  Labs (all labs ordered are listed, but only abnormal results are displayed) Labs Reviewed  COVID-19, FLU A+B AND RSV    EKG   Radiology No results found.  Procedures Procedures (including critical care time)  Medications Ordered in UC Medications - No data to display  Initial Impression / Assessment and Plan / UC Course  I have reviewed the triage vital signs and the nursing notes.  Pertinent labs & imaging results that were available during my care of the patient were reviewed by me and considered in my medical decision making (see chart for details).    Viral illness, productive cough.  Patient declines chest xray today.  Her lungs sound clear; O2 sat 95% on room air.  COVID, Flu, and RSV pending.  Instructed patient to self quarantine per CDC guidelines.  Treating cough with Tessalon Perles.  Discussed symptomatic treatment including Tylenol, rest, hydration.  Instructed patient to follow up with PCP if symptoms are not improving.  Patient agrees to plan of care.   Final Clinical Impressions(s) / UC Diagnoses   Final diagnoses:  Viral illness  Productive cough     Discharge Instructions  Your COVID and Flu tests are pending.  You should self quarantine until the test results are back.    Take the [facility] as needed for cough.  Take Tylenol as needed for fever or discomfort.  Rest and keep yourself hydrated.    Follow-up with your primary care provider if your symptoms are not improving.         ED Prescriptions     Medication Sig Dispense Auth. Provider   benzonatate (TESSALON) 100 MG capsule Take 1 capsule (100 mg total) by mouth 3 (three) times daily as needed for cough.  21 capsule Diana Balloon, NP      PDMP not reviewed this encounter.   Diana Balloon, NP 08/21/21 [phone]

## 2021-08-21 NOTE — ED Triage Notes (Signed)
Pt here with sinus pressure, headache, sore throat and nasal drainage without a fever x 3 days. Tested negative for COVID last night at home. Pt unsure about whether or not she would like additional viral testing.

## 2021-08-23 LAB — COVID-19, FLU A+B AND RSV
Influenza A, NAA: NOT DETECTED
Influenza B, NAA: NOT DETECTED
RSV, NAA: NOT DETECTED
SARS-CoV-2, NAA: NOT DETECTED

## 2021-08-28 ENCOUNTER — Other Ambulatory Visit: Payer: Self-pay | Admitting: Student

## 2021-08-28 DIAGNOSIS — Z1231 Encounter for screening mammogram for malignant neoplasm of breast: Secondary | ICD-10-CM

## 2021-09-28 ENCOUNTER — Other Ambulatory Visit: Payer: Self-pay

## 2021-09-28 ENCOUNTER — Ambulatory Visit
Admission: RE | Admit: 2021-09-28 | Discharge: 2021-09-28 | Disposition: A | Discharge status: Home / Self Care | Payer: Medicare HMO | Source: Ambulatory Visit | Attending: Student | Admitting: Student

## 2021-09-28 DIAGNOSIS — Z1231 Encounter for screening mammogram for malignant neoplasm of breast: Secondary | ICD-10-CM | POA: Insufficient documentation

## 2022-07-17 ENCOUNTER — Encounter: Payer: Self-pay | Admitting: Ophthalmology

## 2022-07-22 NOTE — Discharge Instructions (Signed)

## 2022-07-24 ENCOUNTER — Ambulatory Visit: Payer: Medicare HMO | Admitting: Anesthesiology

## 2022-07-24 ENCOUNTER — Other Ambulatory Visit: Payer: Self-pay

## 2022-07-24 ENCOUNTER — Encounter
Admission: RE | Disposition: A | Discharge status: Home / Self Care | Payer: Self-pay | Source: Home / Self Care | Attending: Ophthalmology

## 2022-07-24 ENCOUNTER — Encounter: Payer: Self-pay | Admitting: Ophthalmology

## 2022-07-24 ENCOUNTER — Ambulatory Visit
Admission: RE | Admit: 2022-07-24 | Discharge: 2022-07-24 | Disposition: A | Discharge status: Home / Self Care | Payer: Medicare HMO | Attending: Ophthalmology | Admitting: Ophthalmology

## 2022-07-24 DIAGNOSIS — H2511 Age-related nuclear cataract, right eye: Secondary | ICD-10-CM | POA: Diagnosis not present

## 2022-07-24 DIAGNOSIS — I10 Essential (primary) hypertension: Secondary | ICD-10-CM | POA: Diagnosis not present

## 2022-07-24 DIAGNOSIS — G4730 Sleep apnea, unspecified: Secondary | ICD-10-CM | POA: Diagnosis not present

## 2022-07-24 DIAGNOSIS — Z6838 Body mass index (BMI) 38.0-38.9, adult: Secondary | ICD-10-CM | POA: Insufficient documentation

## 2022-07-24 DIAGNOSIS — E669 Obesity, unspecified: Secondary | ICD-10-CM | POA: Insufficient documentation

## 2022-07-24 DIAGNOSIS — E7800 Pure hypercholesterolemia, unspecified: Secondary | ICD-10-CM | POA: Insufficient documentation

## 2022-07-24 DIAGNOSIS — E1136 Type 2 diabetes mellitus with diabetic cataract: Secondary | ICD-10-CM | POA: Insufficient documentation

## 2022-07-24 HISTORY — PX: CATARACT EXTRACTION W/PHACO: SHX586

## 2022-07-24 LAB — GLUCOSE, CAPILLARY: Glucose-Capillary: 136 mg/dL — ABNORMAL HIGH (ref 70–99)

## 2022-07-24 SURGERY — PHACOEMULSIFICATION, CATARACT, WITH IOL INSERTION
Anesthesia: Monitor Anesthesia Care | Site: Eye | Laterality: Right

## 2022-07-24 MED ORDER — SIGHTPATH DOSE#1 BSS IO SOLN
INTRAOCULAR | Status: DC | PRN
  Administered 2022-07-24: 2 mL

## 2022-07-24 MED ORDER — LACTATED RINGERS IV SOLN: INTRAVENOUS | Status: DC

## 2022-07-24 MED ORDER — SIGHTPATH DOSE#1 BSS IO SOLN
INTRAOCULAR | Status: DC | PRN
  Administered 2022-07-24: 15 mL via INTRAOCULAR

## 2022-07-24 MED ORDER — ONDANSETRON HCL 4 MG/2ML IJ SOLN: 4.0000 mg | Freq: Once | INTRAMUSCULAR | Status: DC | PRN

## 2022-07-24 MED ORDER — SIGHTPATH DOSE#1 BSS IO SOLN
INTRAOCULAR | Status: DC | PRN
  Administered 2022-07-24: 49 mL via OPHTHALMIC

## 2022-07-24 MED ORDER — MIDAZOLAM HCL 2 MG/2ML IJ SOLN
INTRAMUSCULAR | Status: DC | PRN
  Administered 2022-07-24: 2 mg via INTRAVENOUS

## 2022-07-24 MED ORDER — FENTANYL CITRATE (PF) 100 MCG/2ML IJ SOLN
INTRAMUSCULAR | Status: DC | PRN
  Administered 2022-07-24: 100 ug via INTRAVENOUS

## 2022-07-24 MED ORDER — TETRACAINE HCL 0.5 % OP SOLN
1.0000 [drp] | OPHTHALMIC | Status: DC | PRN
  Administered 2022-07-24 (×3): 1 [drp] via OPHTHALMIC

## 2022-07-24 MED ORDER — FENTANYL CITRATE PF 50 MCG/ML IJ SOSY: 25.0000 ug | PREFILLED_SYRINGE | INTRAMUSCULAR | Status: DC | PRN

## 2022-07-24 MED ORDER — BRIMONIDINE TARTRATE-TIMOLOL 0.2-0.5 % OP SOLN
OPHTHALMIC | Status: DC | PRN
  Administered 2022-07-24: 1 [drp] via OPHTHALMIC

## 2022-07-24 MED ORDER — SIGHTPATH DOSE#1 NA HYALUR & NA CHOND-NA HYALUR IO KIT
PACK | INTRAOCULAR | Status: DC | PRN
  Administered 2022-07-24: 1 via OPHTHALMIC

## 2022-07-24 MED ORDER — ARMC OPHTHALMIC DILATING DROPS
1.0000 | OPHTHALMIC | Status: DC | PRN
  Administered 2022-07-24 (×3): 1 via OPHTHALMIC

## 2022-07-24 MED ORDER — CEFUROXIME OPHTHALMIC INJECTION 1 MG/0.1 ML
INJECTION | OPHTHALMIC | Status: DC | PRN
  Administered 2022-07-24: .1 mL via INTRACAMERAL

## 2022-07-24 SURGICAL SUPPLY — 12 items
CANNULA ANT/CHMB 27G (MISCELLANEOUS) IMPLANT
CANNULA ANT/CHMB 27GA (MISCELLANEOUS) IMPLANT
CATARACT SUITE SIGHTPATH (MISCELLANEOUS) ×1 IMPLANT
FEE CATARACT SUITE SIGHTPATH (MISCELLANEOUS) ×1 IMPLANT
GLOVE SRG 8 PF TXTR STRL LF DI (GLOVE) ×1 IMPLANT
GLOVE SURG ENC TEXT LTX SZ7.5 (GLOVE) ×1 IMPLANT
GLOVE SURG UNDER POLY LF SZ8 (GLOVE) ×1
LENS IOL TECNIS EYHANCE 19.5 (Intraocular Lens) IMPLANT
NDL FILTER BLUNT 18X1 1/2 (NEEDLE) ×1 IMPLANT
NEEDLE FILTER BLUNT 18X1 1/2 (NEEDLE) ×1 IMPLANT
SYR 3ML LL SCALE MARK (SYRINGE) ×1 IMPLANT
WATER STERILE IRR 250ML POUR (IV SOLUTION) ×1 IMPLANT

## 2022-07-24 NOTE — Anesthesia Preprocedure Evaluation (Signed)
Anesthesia Evaluation  Patient identified by MRN, date of birth, ID band Patient awake    Reviewed: Allergy & Precautions, H&P , NPO status , Patient's Chart, lab work & pertinent test results, reviewed documented beta blocker date and time   Airway Mallampati: II  TM Distance: >3 FB Neck ROM: full    Dental no notable dental hx. (+) Teeth Intact   Pulmonary neg pulmonary ROS, sleep apnea , pneumonia, resolved   Pulmonary exam normal breath sounds clear to auscultation       Cardiovascular Exercise Tolerance: Poor hypertension, On Medications negative cardio ROS  Rhythm:regular Rate:Normal     Neuro/Psych  Headaches  Anxiety      negative psych ROS   GI/Hepatic negative GI ROS, Neg liver ROS, hiatal hernia,,,  Endo/Other  negative endocrine ROSdiabetes    Renal/GU      Musculoskeletal   Abdominal   Peds  Hematology negative hematology ROS (+)   Anesthesia Other Findings   Reproductive/Obstetrics negative OB ROS                             Anesthesia Physical Anesthesia Plan  ASA: 3  Anesthesia Plan: MAC   Post-op Pain Management:    Induction:   PONV Risk Score and Plan:   Airway Management Planned:   Additional Equipment:   Intra-op Plan:   Post-operative Plan:   Informed Consent: I have reviewed the patients History and Physical, chart, labs and discussed the procedure including the risks, benefits and alternatives for the proposed anesthesia with the patient or authorized representative who has indicated his/her understanding and acceptance.       Plan Discussed with: CRNA  Anesthesia Plan Comments:        Anesthesia Quick Evaluation

## 2022-07-24 NOTE — Anesthesia Postprocedure Evaluation (Signed)
Anesthesia Post Note  Patient: Diana Roberts  Procedure(s) Performed: CATARACT EXTRACTION PHACO AND INTRAOCULAR LENS PLACEMENT (IOC) RIGHT DIABETIC 8.30 00:58.7 (Right: Eye)  Patient location during evaluation: PACU Anesthesia Type: MAC Level of consciousness: awake and alert Pain management: pain level controlled Vital Signs Assessment: post-procedure vital signs reviewed and stable Respiratory status: spontaneous breathing, nonlabored ventilation, respiratory function stable and patient connected to nasal cannula oxygen Cardiovascular status: stable and blood pressure returned to baseline Postop Assessment: no apparent nausea or vomiting Anesthetic complications: no   No notable events documented.   Last Vitals:  Vitals:   07/24/22 1131 07/24/22 1139  BP: (!) 127/58 (!) 104/59  Pulse: (!) 58 (!) 51  Resp: (!) 22 19  Temp: (!) 36.3 C (!) 36.3 C  SpO2: 93% 94%    Last Pain:  Vitals:   07/24/22 1139  TempSrc:   PainSc: 0-No pain                 Molli Barrows

## 2022-07-24 NOTE — H&P (Signed)
[facility]   Primary Care Physician:  Nydia Bouton, MD Ophthalmologist: Dr. Leandrew Koyanagi  Pre-Procedure History & Physical: HPI:  Diana Roberts is a 71 y.o. female here for ophthalmic surgery.   Past Medical History:  Diagnosis Date   Allergic state    Anginal pain (HCC)    Anxiety    Arthritis    Chronic insomnia    Colon adenomas    De Quervain's disease (radial styloid tenosynovitis)    Depression    Diabetes mellitus without complication (HCC)    Headache    migraines   History of hiatal hernia    Hypercholesteremia    Hyperkalemia    Hyperlipidemia    Hypertension    Hypomagnesemia    Obesity    Seasonal allergies    Sleep apnea    Report - Care everywhere - 06/09/2014   TMJ (dislocation of temporomandibular joint)    Vitamin B12 deficiency    Vitamin D deficiency     Past Surgical History:  Procedure Laterality Date   CARPAL TUNNEL RELEASE     CATARACT EXTRACTION W/PHACO Left 02/08/2015   Procedure: CATARACT EXTRACTION PHACO AND INTRAOCULAR LENS PLACEMENT ([place]);  Surgeon: Leandrew Koyanagi, MD;  Location: [place];  Service: Ophthalmology;  Laterality: Left;  DIABETIC   CESAREAN SECTION     COLONOSCOPY     COLONOSCOPY WITH PROPOFOL N/A 09/15/2017   Procedure: COLONOSCOPY WITH PROPOFOL;  Surgeon: Lollie Sails, MD;  Location: [facility] ENDOSCOPY;  Service: Endoscopy;  Laterality: N/A;   COLONOSCOPY WITH PROPOFOL N/A 12/08/2017   Procedure: COLONOSCOPY WITH PROPOFOL;  Surgeon: Lollie Sails, MD;  Location: [facility] ENDOSCOPY;  Service: Endoscopy;  Laterality: N/A;   HERNIA REPAIR     KNEE ARTHROSCOPY      Prior to Admission medications   Medication Sig Start Date End Date Taking? Authorizing Provider  Acetaminophen (TYLENOL PO) Take 500 mg by mouth as needed.   Yes [provider]  albuterol (PROVENTIL HFA;VENTOLIN HFA) 108 (90 BASE) MCG/ACT inhaler Inhale 2 puffs into the lungs every 6 (six) hours as needed for  wheezing or shortness of breath.   Yes [provider]  aspirin EC 81 MG tablet Take 81 mg by mouth daily.   Yes [provider]  benzonatate (TESSALON) 100 MG capsule Take 1 capsule (100 mg total) by mouth 3 (three) times daily as needed for cough. 08/21/21  Yes Sharion Balloon, NP  buPROPion (WELLBUTRIN XL) 150 MG 24 hr tablet Take 150 mg by mouth daily.   Yes [provider]  carvedilol (COREG) 6.25 MG tablet Take 6.25 mg by mouth 2 (two) times daily with a meal.   Yes [provider]  Cholecalciferol 2000 UNITS CAPS Take 2,000 Units by mouth daily.    Yes [provider]  Dulaglutide (TRULICITY) 1.19 ER/7.4YC SOPN Inject into the skin. 12/23/19  Yes [provider]  empagliflozin (JARDIANCE) 10 MG TABS tablet Take 10 mg by mouth daily. 09/13/16  Yes [provider]  Ferrous Gluconate (IRON 27 PO) Take 1 tablet by mouth daily.   Yes [provider]  hydrochlorothiazide (HYDRODIURIL) 12.5 MG tablet Take 12.5 mg by mouth daily.   Yes [provider]  hydrOXYzine (ATARAX/VISTARIL) 25 MG tablet Take 1 tablet (25 mg total) by mouth 3 (three) times daily as needed for itching. 03/30/18  Yes Marylene Land, NP  insulin aspart (NOVOLOG) 100 UNIT/ML injection Inject 10 Units into the skin 3 (three) times daily  with meals. 06/28/16  Yes Gladstone Lighter, MD  Insulin Glargine (BASAGLAR KWIKPEN [place]) Inject 35 Units into the skin every evening.   Yes [provider]  lisinopril (PRINIVIL,ZESTRIL) 40 MG tablet Take 20 mg by mouth daily.   Yes [provider]  magnesium oxide (MAG-OX) 400 MG tablet Take 400 mg by mouth 2 (two) times daily.   Yes [provider]  meloxicam (MOBIC) 15 MG tablet Take 0.5-1 tablets (7.5-15 mg total) by mouth daily as needed. 11/03/20  Yes Cook, Jayce G, DO  metFORMIN (GLUCOPHAGE) 1000 MG tablet Take 1,000 mg by mouth daily with breakfast.    Yes [provider]  Multiple  Vitamin (MULTIVITAMIN) tablet Take 1 tablet by mouth daily.   Yes [provider]  omega-3 acid ethyl esters (LOVAZA) 1 G capsule Take 1 g by mouth at bedtime.   Yes [provider]  oxybutynin (DITROPAN-XL) 5 MG 24 hr tablet Take 5 mg by mouth at bedtime.   Yes [provider]  sertraline (ZOLOFT) 100 MG tablet Take 100 mg by mouth daily.   Yes [provider]  simvastatin (ZOCOR) 40 MG tablet Take 40 mg by mouth at bedtime.    Yes [provider]  traZODone (DESYREL) 50 MG tablet Take 50 mg by mouth at bedtime as needed. 09/19/20  Yes [provider]  clonazePAM (KLONOPIN) 0.5 MG tablet Take 0.5 mg by mouth 2 (two) times daily. Patient not taking: Reported on 07/17/2022    [provider]  HYDROcodone-acetaminophen (NORCO/VICODIN) 5-325 MG tablet Take 1 tablet by mouth every 8 (eight) hours as needed for moderate pain or severe pain. Patient not taking: Reported on 07/17/2022 11/03/20   Coral Spikes, DO  insulin glargine (LANTUS) 100 UNIT/ML injection Inject 70 Units into the skin at bedtime.  Patient not taking: Reported on 07/17/2022    [provider]  liraglutide 18 MG/3ML SOPN Inject 1.8 mg into the skin daily. 09/13/16 11/03/20  [provider]    Allergies as of 06/26/2022 - Review Complete 08/21/2021  Allergen Reaction Noted   Ciprofloxacin Hives 02/01/2015   Pseudoephedrine-guaifenesin  09/12/2017   Tylenol sinus severe [pseudoephed-apap-guaifenesin] Rash 02/01/2015    Family History  Problem Relation Age of Onset   Cancer Mother    Aneurysm Father    Breast cancer Neg Hx     Social History   Socioeconomic History   Marital status: Widowed    Spouse name: Not on file   Number of children: Not on file   Years of education: Not on file   Highest education level: Not on file  Occupational History   Not on file  Tobacco Use   Smoking status: Never   Smokeless tobacco: Never  Vaping Use   Vaping  Use: Never used  Substance and Sexual Activity   Alcohol use: No   Drug use: No   Sexual activity: Not on file  Other Topics Concern   Not on file  Social History Narrative   Not on file   Social Determinants of Health   Financial Resource Strain: Not on file  Food Insecurity: Not on file  Transportation Needs: Not on file  Physical Activity: Not on file  Stress: Not on file  Social Connections: Not on file  Intimate Partner Violence: Not on file    Review of Systems: See HPI, otherwise negative ROS  Physical Exam: BP (!) 153/70   Temp 98.3 F (36.8 C) (Tympanic)   Ht '4\' 9"'$  (  1.448 m)   Wt 80.3 kg   SpO2 95%   BMI 38.30 kg/m  General:   Alert,  pleasant and cooperative in NAD Head:  Normocephalic and atraumatic. Lungs:  Clear to auscultation.    Heart:  Regular rate and rhythm.   Impression/Plan: Diana Roberts is here for ophthalmic surgery.  Risks, benefits, limitations, and alternatives regarding ophthalmic surgery have been reviewed with the patient.  Questions have been answered.  All parties agreeable.   Leandrew Koyanagi, MD  07/24/2022, 10:46 AM

## 2022-07-24 NOTE — Op Note (Signed)
LOCATION:  [place]   PREOPERATIVE DIAGNOSIS:    Nuclear sclerotic cataract right eye. H25.11   POSTOPERATIVE DIAGNOSIS:  Nuclear sclerotic cataract right eye.     PROCEDURE:  Phacoemusification with posterior chamber intraocular lens placement of the right eye   ULTRASOUND TIME: Procedure(s): CATARACT EXTRACTION PHACO AND INTRAOCULAR LENS PLACEMENT (IOC) RIGHT DIABETIC 8.30 00:58.7 (Right)  LENS:   Implant Name Type Inv. Item Serial No. Manufacturer Lot No. LRB No. Used Action  LENS IOL TECNIS EYHANCE 19.5 - [id number] Intraocular Lens LENS IOL TECNIS EYHANCE 19.5 [id number] [employer]  Right 1 Implanted         SURGEON:  Wyonia Hough, MD   ANESTHESIA:  Topical with tetracaine drops and 2% Xylocaine jelly, augmented with 1% preservative-free intracameral lidocaine.    COMPLICATIONS:  None.   DESCRIPTION OF PROCEDURE:  The patient was identified in the holding room and transported to the operating room and placed in the supine position under the operating microscope.  The right eye was identified as the operative eye and it was prepped and draped in the usual sterile ophthalmic fashion.   A 1 millimeter clear-corneal paracentesis was made at the 12:00 position.  0.5 ml of preservative-free 1% lidocaine was injected into the anterior chamber. The anterior chamber was filled with Viscoat viscoelastic.  A 2.4 millimeter keratome was used to make a near-clear corneal incision at the 9:00 position.  A curvilinear capsulorrhexis was made with a cystotome and capsulorrhexis forceps.  Balanced salt solution was used to hydrodissect and hydrodelineate the nucleus.   Phacoemulsification was then used in stop and chop fashion to remove the lens nucleus and epinucleus.  The remaining cortex was then removed using the irrigation and aspiration handpiece. Provisc was then placed into the capsular bag to distend it for lens placement.  A lens was then injected into the capsular  bag.  The remaining viscoelastic was aspirated.   Wounds were hydrated with balanced salt solution.  The anterior chamber was inflated to a physiologic pressure with balanced salt solution.  No wound leaks were noted. Cefuroxime 0.1 ml of a '10mg'$ /ml solution was injected into the anterior chamber for a dose of 1 mg of intracameral antibiotic at the completion of the case.   Timolol and Brimonidine drops were applied to the eye.  The patient was taken to the recovery room in stable condition without complications of anesthesia or surgery.   Diana Roberts 07/24/2022, 11:30 AM

## 2022-07-24 NOTE — Transfer of Care (Signed)
Immediate Anesthesia Transfer of Care Note  Patient: Diana Roberts  Procedure(s) Performed: CATARACT EXTRACTION PHACO AND INTRAOCULAR LENS PLACEMENT ([facility]) RIGHT DIABETIC 8.30 00:58.7 (Right: Eye)  Patient Location: PACU  Anesthesia Type: MAC  Level of Consciousness: awake, alert  and patient cooperative  Airway and Oxygen Therapy: Patient Spontanous Breathing and Patient connected to supplemental oxygen  Post-op Assessment: Post-op Vital signs reviewed, Patient's Cardiovascular Status Stable, Respiratory Function Stable, Patent Airway and No signs of Nausea or vomiting  Post-op Vital Signs: Reviewed and stable  Complications: No notable events documented.

## 2022-07-25 ENCOUNTER — Encounter: Payer: Self-pay | Admitting: Ophthalmology

## 2022-09-04 ENCOUNTER — Other Ambulatory Visit: Payer: Self-pay | Admitting: Student

## 2022-09-04 DIAGNOSIS — Z1231 Encounter for screening mammogram for malignant neoplasm of breast: Secondary | ICD-10-CM

## 2022-09-30 ENCOUNTER — Ambulatory Visit
Admission: RE | Admit: 2022-09-30 | Discharge: 2022-09-30 | Disposition: A | Discharge status: Home / Self Care | Payer: Medicare HMO | Source: Ambulatory Visit | Attending: Student | Admitting: Student

## 2022-09-30 DIAGNOSIS — Z1231 Encounter for screening mammogram for malignant neoplasm of breast: Secondary | ICD-10-CM | POA: Diagnosis not present

## 2022-10-15 ENCOUNTER — Other Ambulatory Visit: Payer: Self-pay

## 2022-10-15 ENCOUNTER — Telehealth: Payer: Self-pay

## 2022-10-15 DIAGNOSIS — Z86010 Personal history of colonic polyps: Secondary | ICD-10-CM

## 2022-10-15 DIAGNOSIS — E782 Mixed hyperlipidemia: Secondary | ICD-10-CM | POA: Insufficient documentation

## 2022-10-15 DIAGNOSIS — E1129 Type 2 diabetes mellitus with other diabetic kidney complication: Secondary | ICD-10-CM | POA: Insufficient documentation

## 2022-10-15 DIAGNOSIS — G43909 Migraine, unspecified, not intractable, without status migrainosus: Secondary | ICD-10-CM | POA: Insufficient documentation

## 2022-10-15 DIAGNOSIS — T7840XA Allergy, unspecified, initial encounter: Secondary | ICD-10-CM | POA: Insufficient documentation

## 2022-10-15 DIAGNOSIS — I10 Essential (primary) hypertension: Secondary | ICD-10-CM | POA: Insufficient documentation

## 2022-10-15 MED ORDER — NA SULFATE-K SULFATE-MG SULF 17.5-3.13-1.6 GM/177ML PO SOLN: 1.0000 | Freq: Once | ORAL | 0 refills | Status: AC

## 2022-10-15 MED ORDER — ONDANSETRON HCL 4 MG PO TABS: 4.0000 mg | ORAL_TABLET | Freq: Four times a day (QID) | ORAL | 0 refills | Status: AC | PRN

## 2022-10-15 NOTE — Telephone Encounter (Signed)
Gastroenterology Pre-Procedure Review  Request Date: 11/06/22 Requesting Physician: Dr. Marius Ditch  PATIENT REVIEW QUESTIONS: The patient responded to the following health history questions as indicated:    1. Are you having any GI issues? no 2. Do you have a personal history of Polyps? yes (last colonoscopy performed by Dr. Gustavo Lah at [facility] on 12/08/17 polyps were noted) 3. Do you have a family history of Colon Cancer or Polyps? no 4. Diabetes Mellitus? yes (Stop Trulicity [id number] must be stopped 7 days prior to colonoscopy. Stop Jardiance 3 days prior to colonoscopy on 11/03/22. Stop Metformin2 days prior to colonoscopy on 11/04/22.  All vitamins and supplements must be stopped 5 days prior to colonoscopy on 11/01/22) 5. Joint replacements in the past 12 months?no 6. Major health problems in the past 3 months?no 7. Any artificial heart valves, MVP, or defibrillator?no    MEDICATIONS & ALLERGIES:    Patient reports the following regarding taking any anticoagulation/antiplatelet therapy:   Plavix, Coumadin, Eliquis, Xarelto, Lovenox, Pradaxa, Brilinta, or Effient? no Aspirin? no  Patient confirms/reports the following medications:  Current Outpatient Medications  Medication Sig Dispense Refill   Acetaminophen (TYLENOL PO) Take 500 mg by mouth as needed.     albuterol (PROVENTIL HFA;VENTOLIN HFA) 108 (90 BASE) MCG/ACT inhaler Inhale 2 puffs into the lungs every 6 (six) hours as needed for wheezing or shortness of breath.     aspirin EC 81 MG tablet Take 81 mg by mouth daily.     benzonatate (TESSALON) 100 MG capsule Take 1 capsule (100 mg total) by mouth 3 (three) times daily as needed for cough. 21 capsule 0   buPROPion (WELLBUTRIN XL) 150 MG 24 hr tablet Take 150 mg by mouth daily.     carvedilol (COREG) 6.25 MG tablet Take 6.25 mg by mouth 2 (two) times daily with a meal.     Cholecalciferol 2000 UNITS CAPS Take 2,000 Units by mouth daily.      clonazePAM (KLONOPIN) 0.5 MG tablet Take  0.5 mg by mouth 2 (two) times daily. (Patient not taking: Reported on 07/17/2022)     Dulaglutide (TRULICITY) [id number] SOPN Inject into the skin.     empagliflozin (JARDIANCE) 10 MG TABS tablet Take 10 mg by mouth daily.     Ferrous Gluconate (IRON 27 PO) Take 1 tablet by mouth daily.     hydrochlorothiazide (HYDRODIURIL) 12.5 MG tablet Take 12.5 mg by mouth daily.     HYDROcodone-acetaminophen (NORCO/VICODIN) 5-325 MG tablet Take 1 tablet by mouth every 8 (eight) hours as needed for moderate pain or severe pain. (Patient not taking: Reported on 07/17/2022) 10 tablet 0   hydrOXYzine (ATARAX/VISTARIL) 25 MG tablet Take 1 tablet (25 mg total) by mouth 3 (three) times daily as needed for itching. 20 tablet 0   insulin aspart (NOVOLOG) 100 UNIT/ML injection Inject 10 Units into the skin 3 (three) times daily with meals. 10 mL 11   Insulin Glargine (BASAGLAR KWIKPEN [facility]) Inject 35 Units into the skin every evening.     insulin glargine (LANTUS) 100 UNIT/ML injection Inject 70 Units into the skin at bedtime.  (Patient not taking: Reported on 07/17/2022)     lisinopril (PRINIVIL,ZESTRIL) 40 MG tablet Take 20 mg by mouth daily.     magnesium oxide (MAG-OX) 400 MG tablet Take 400 mg by mouth 2 (two) times daily.     meloxicam (MOBIC) 15 MG tablet Take 0.5-1 tablets (7.5-15 mg total) by mouth daily as needed. 30 tablet 0   metFORMIN (GLUCOPHAGE)  1000 MG tablet Take 1,000 mg by mouth daily with breakfast.      Multiple Vitamin (MULTIVITAMIN) tablet Take 1 tablet by mouth daily.     omega-3 acid ethyl esters (LOVAZA) 1 G capsule Take 1 g by mouth at bedtime.     oxybutynin (DITROPAN-XL) 5 MG 24 hr tablet Take 5 mg by mouth at bedtime.     sertraline (ZOLOFT) 100 MG tablet Take 100 mg by mouth daily.     simvastatin (ZOCOR) 40 MG tablet Take 40 mg by mouth at bedtime.      traZODone (DESYREL) 50 MG tablet Take 50 mg by mouth at bedtime as needed.     No current facility-administered medications for this  visit.    Patient confirms/reports the following allergies:  Allergies  Allergen Reactions   Ciprofloxacin Hives   Pseudoephedrine-Guaifenesin    Tylenol Sinus Severe [Pseudoephed-Apap-Guaifenesin] Rash    No orders of the defined types were placed in this encounter.   AUTHORIZATION INFORMATION Primary Insurance: 1D#: Group #:  Secondary Insurance: 1D#: Group #:  SCHEDULE INFORMATION: Date: 11/06/22 Time: Location: [facility]

## 2022-11-04 ENCOUNTER — Other Ambulatory Visit: Payer: Self-pay

## 2022-11-04 ENCOUNTER — Emergency Department
Admission: EM | Admit: 2022-11-04 | Discharge: 2022-11-04 | Disposition: A | Discharge status: Home / Self Care | Payer: Medicare HMO | Attending: Emergency Medicine | Admitting: Emergency Medicine

## 2022-11-04 ENCOUNTER — Telehealth: Payer: Self-pay

## 2022-11-04 ENCOUNTER — Emergency Department: Payer: Medicare HMO

## 2022-11-04 DIAGNOSIS — R42 Dizziness and giddiness: Secondary | ICD-10-CM | POA: Insufficient documentation

## 2022-11-04 DIAGNOSIS — I10 Essential (primary) hypertension: Secondary | ICD-10-CM | POA: Diagnosis not present

## 2022-11-04 LAB — URINALYSIS, ROUTINE W REFLEX MICROSCOPIC
Bacteria, UA: NONE SEEN
Bilirubin Urine: NEGATIVE
Glucose, UA: NEGATIVE mg/dL
Hgb urine dipstick: NEGATIVE
Ketones, ur: NEGATIVE mg/dL
Leukocytes,Ua: NEGATIVE
Nitrite: NEGATIVE
Protein, ur: 100 mg/dL — AB
Specific Gravity, Urine: 1.008 (ref 1.005–1.030)
pH: 7 (ref 5.0–8.0)

## 2022-11-04 LAB — BASIC METABOLIC PANEL
Anion gap: 11 (ref 5–15)
BUN: 28 mg/dL — ABNORMAL HIGH (ref 8–23)
CO2: 25 mmol/L (ref 22–32)
Calcium: 10.1 mg/dL (ref 8.9–10.3)
Chloride: 105 mmol/L (ref 98–111)
Creatinine, Ser: 0.84 mg/dL (ref 0.44–1.00)
GFR, Estimated: >60 mL/min (ref >60)
Glucose, Bld: 131 mg/dL — ABNORMAL HIGH (ref 70–99)
Potassium: 4.8 mmol/L (ref 3.5–5.1)
Sodium: 141 mmol/L (ref 135–145)

## 2022-11-04 LAB — CBC
HCT: 42.1 % (ref 36.0–46.0)
Hemoglobin: 13.9 g/dL (ref 12.0–15.0)
MCH: 31 pg (ref 26.0–34.0)
MCHC: 33 g/dL (ref 30.0–36.0)
MCV: 93.8 fL (ref 80.0–100.0)
Platelets: 255 10*3/uL (ref 150–400)
RBC: 4.49 MIL/uL (ref 3.87–5.11)
RDW: 12.2 % (ref 11.5–15.5)
WBC: 8.7 10*3/uL (ref 4.0–10.5)
nRBC: 0 % (ref 0.0–0.2)

## 2022-11-04 MED ORDER — MECLIZINE HCL 12.5 MG PO TABS: 12.5000 mg | ORAL_TABLET | Freq: Three times a day (TID) | ORAL | 0 refills | Status: AC | PRN

## 2022-11-04 MED ORDER — MECLIZINE HCL 25 MG PO TABS
12.5000 mg | ORAL_TABLET | Freq: Once | ORAL | Status: AC
  Administered 2022-11-04: 12.5 mg via ORAL
  Filled 2022-11-04: qty 1

## 2022-11-04 NOTE — Telephone Encounter (Signed)
Per ER patient requesting to CANCEL colonscopy for tomorrow, is in ER with vertigo today. Thank you   Kyung Rudd and got patient cancel.

## 2022-11-04 NOTE — ED Provider Notes (Signed)
[facility] Provider Note    Event Date/Time   First MD Initiated Contact with Patient 11/04/22 1040     (approximate)   History   Dizziness   HPI  Diana Roberts is a 72 y.o. female history of insomnia, hyperlipidemia hypertension hyperkalemia vitamin deficiency.    Reviewed primary care note from February of this year patient treated for type 2 diabetes  Patient reports since Friday she has been feeling a little bit lightheaded especially when she moves her head.  This morning got worse she used the bathroom and then when she turned her head she felt "drunk" a spinning feeling.  She reports she has had a history of vertigo in the past and seems to be similar.  Otherwise no fevers no chills no chest pain no trouble breathing.  No pain or burning with urination or other symptoms.  She has been eating and drinking normally.  She got a call from her pharmacist asking if she was feeling all right and the pharmacy told her that one of her medications might have been recalled.  She reports she went to [employer].  Of note, reviewed recent FDA recalls, as well as [employer] and I do not see any recent medication recalls for which this patient is taking.  She thinks it was either her simvastatin or sertraline  Patient reports a chronic history of neck pain for a couple of months at least.  No change from typical.  Additionally, she reports today having a very mild feeling of a headache this morning over her front of her scalp  No trouble speaking.  No difficulty with use of arms or legs  Physical Exam   Triage Vital Signs: ED Triage Vitals  Enc Vitals Group     BP 11/04/22 1018 (!) 157/75     Pulse Rate 11/04/22 1018 63     Resp 11/04/22 1017 16     Temp 11/04/22 1017 98.4 F (36.9 C)     Temp src --      SpO2 11/04/22 1018 95 %     Weight 11/04/22 1018 169 lb (76.7 kg)     Height 11/04/22 1018 4\' 10"  (1.473 m)     Head Circumference --      Peak  Flow --      Pain Score 11/04/22 1018 5     Pain Loc --      Pain Edu? --      Excl. in [place]? --     Most recent vital signs: Vitals:   11/04/22 1017 11/04/22 1018  BP:  (!) 157/75  Pulse:  63  Resp: 16   Temp: 98.4 F (36.9 C)   SpO2:  95%     General: Awake, no distress.  Very pleasant.  No facial droop.  Moves all extremities well without distress or discomfort.  5 out of 5 strength all extremities speech is clear.  Normal finger-nose-finger bilaterally.  When she turns her head side-to-side quickly she does report a feeling of "dizziness" coming about her.  I do not see any obvious nystagmus however. CV:  Good peripheral perfusion.  Normal rate and perfusion Resp:  Normal effort.  Clear bilaterally Abd:  No distention.  Soft nontender nondistended Other:  5 out of 5 strength in extremities.  No pronator drift any extremity Gives membranes moist.  Appears well-hydrated  ED Results / Procedures / Treatments   Labs (all labs ordered are listed, but only abnormal results are  displayed) Labs Reviewed  BASIC METABOLIC PANEL - Abnormal; Notable for the following components:      Result Value   Glucose, Bld 131 (*)    BUN 28 (*)    All other components within normal limits  URINALYSIS, ROUTINE W REFLEX MICROSCOPIC - Abnormal; Notable for the following components:   Color, Urine COLORLESS (*)    APPearance CLEAR (*)    Protein, ur 100 (*)    All other components within normal limits  CBC     EKG  And interpreted by me 1025 heart rate 60 QRS 80 QTc 400 Normal sinus rhythm no evidence of acute ischemia.  Old anteroseptal infarct.  Compared with 2017, anteroseptal Q waves are chronic   RADIOLOGY  MRI ordered patient has multiple risk factors for stroke.  Though my clinical exam seems to be most suggestive of a peripheral vertigo, I do wish to exclude central vertigo or subtle stroke which may be inducing her feeling of dizziness   MR BRAIN WO CONTRAST  Result Date:  11/04/2022 CLINICAL DATA:  Trauma. Venous injury suspected mild headache. Rule out stroke. EXAM: MRI HEAD WITHOUT CONTRAST TECHNIQUE: Multiplanar, multiecho pulse sequences of the brain and surrounding structures were obtained without intravenous contrast. COMPARISON:  None Available. FINDINGS: Brain: No acute infarction, hemorrhage, hydrocephalus, extra-axial collection or mass lesion. Sequela of mild chronic microvascular ischemic change Vascular: Normal flow voids. Skull and upper cervical spine: Normal marrow signal. Sinuses/Orbits: No mastoid or middle ear effusion. Paranasal sinuses are clear. Bilateral lens replacement. Orbits are otherwise unremarkable. Other: None. IMPRESSION: 1. No acute intracranial abnormality. 2. Mild chronic microvascular ischemic changes of the white matter. Electronically Signed   By: Marin Roberts M.D.   On: 11/04/2022 12:08      PROCEDURES:  Critical Care performed: No  Procedures   MEDICATIONS ORDERED IN ED: Medications  meclizine (ANTIVERT) tablet 12.5 mg (12.5 mg Oral Given 11/04/22 1117)     IMPRESSION / MDM / ASSESSMENT AND PLAN / ED COURSE  I reviewed the triage vital signs and the nursing notes.                              Differential diagnosis includes, but is not limited to, possible vertigo of peripheral or central nature, dehydration, electrolyte abnormality, orthostatic, no chest pain no acute cardiovascular symptoms, no associated abdominal pain no pain or burning with urination, exclude urinary UTI, metabolic abnormality etc.  Very reassuring clinical examination at this time.  ----------------------------------------- 11:41 AM on 11/04/2022 ----------------------------------------- Lab results reassuring.  Normal CBC and metabolic panel.  Urinalysis negative  Patient's presentation is most consistent with acute complicated illness / injury requiring diagnostic workup.   ----------------------------------------- 12:18 PM on  11/04/2022 ----------------------------------------- MRI negative for acute finding.  Patient resting comfortably no distress.  Recommendation to utilize meclizine as needed, patient reports use of this in the past not sure if she has full supply at the house or not.  Additional prescription provided in case she needs it.  Will follow-up with her primary care.  Careful return precautions advised the patient and her daughter.  Patient going home with her daughter.  Return precautions and treatment recommendations and follow-up discussed with the patient who is agreeable with the plan.  Peripheral vertigo suspected at this time      FINAL CLINICAL IMPRESSION(S) / ED DIAGNOSES   Final diagnoses:  Dizziness  Vertigo  Lightheadedness     Rx /  DC Orders   ED Discharge Orders          Ordered    meclizine (ANTIVERT) 12.5 MG tablet  3 times daily PRN        11/04/22 1217             Note:  This document was prepared using Dragon voice recognition software and may include unintentional dictation errors.   Delman Kitten, MD 11/04/22 1218

## 2022-11-04 NOTE — ED Triage Notes (Signed)
Pt to ED [facility] from home for dizziness for past few days with headache. Hx vertigo  Reports dizziness with moving head

## 2022-11-04 NOTE — ED Notes (Signed)
Urine sample sent at this time.

## 2022-11-06 ENCOUNTER — Ambulatory Visit: Admission: RE | Admit: 2022-11-06 | Payer: Medicare HMO | Source: Home / Self Care | Admitting: Gastroenterology

## 2022-11-06 ENCOUNTER — Encounter: Admission: RE | Payer: Self-pay | Source: Home / Self Care

## 2022-11-06 SURGERY — COLONOSCOPY WITH PROPOFOL
Anesthesia: General

## 2023-03-25 ENCOUNTER — Ambulatory Visit
Admission: EM | Admit: 2023-03-25 | Discharge: 2023-03-25 | Disposition: A | Discharge status: Home / Self Care | Payer: Medicare HMO | Attending: Family Medicine | Admitting: Family Medicine

## 2023-03-25 DIAGNOSIS — J988 Other specified respiratory disorders: Secondary | ICD-10-CM | POA: Insufficient documentation

## 2023-03-25 DIAGNOSIS — Z1152 Encounter for screening for COVID-19: Secondary | ICD-10-CM | POA: Insufficient documentation

## 2023-03-25 DIAGNOSIS — U071 COVID-19: Secondary | ICD-10-CM | POA: Insufficient documentation

## 2023-03-25 MED ORDER — FLUTICASONE PROPIONATE 50 MCG/ACT NA SUSP: 1.0000 | Freq: Two times a day (BID) | NASAL | 0 refills | Status: AC | PRN

## 2023-03-25 MED ORDER — PAXLOVID (150/100) 10 X 150 MG & 10 X 100MG PO TBPK: 2.0000 | ORAL_TABLET | Freq: Two times a day (BID) | ORAL | 0 refills | Status: AC

## 2023-03-25 NOTE — ED Provider Notes (Signed)
Renaldo Fiddler    CSN: [id number] Arrival date & time: 03/25/23  1307      History   Chief Complaint Chief Complaint  Patient presents with   Covid Positive    HPI Diana Roberts is a 72 y.o. female.   HPI Patient here do to testing positive for COVID x 1 day ago. Symptom started 5 days ago while on a cruise. Current symptoms include headache/ productive cough with dark green mucus and sinus congestion. Denies shortness of breath, chest tightness,  or wheezing.  Patient has Diabetes, Hyperlipidemia, and Hypertension increased risk for complication related to COVID. She would like anti-viral. Last GFR within 6 months >60. Past Medical History:  Diagnosis Date   Allergic state    Anginal pain (HCC)    Anxiety    Arthritis    Chronic insomnia    Colon adenomas    De Quervain's disease (radial styloid tenosynovitis)    Depression    Diabetes mellitus without complication (HCC)    Headache    migraines   History of hiatal hernia    Hypercholesteremia    Hyperkalemia    Hyperlipidemia    Hypertension    Hypomagnesemia    Obesity    Seasonal allergies    Sleep apnea    Report - Care everywhere - 06/09/2014   TMJ (dislocation of temporomandibular joint)    Vitamin B12 deficiency    Vitamin D deficiency     Patient Active Problem List   Diagnosis Date Noted   Allergy 10/15/2022   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   Controlled type 2 diabetes mellitus with microalbuminuria, with long-term current use of insulin (HCC) 10/15/2022   Essential hypertension 10/15/2022   Migraines 10/15/2022   Osteopenia of left hip 10/27/2019   History of colon polyps 09/19/2017   Tommi Rumps Quervain's disease (radial styloid tenosynovitis) 07/18/2016   CAP (community acquired pneumonia) 06/27/2016   Chronic TMJ pain 06/25/2016   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/12/2015   Mass of joint of ankle 04/19/2015   Mixed stress and urge urinary incontinence 06/08/2014   S/P repair  of ventral hernia 01/09/2014   Obesity (BMI 30-39.9) 12/26/2013   Vitamin B12 deficiency 04/07/2013   Hyperkalemia 11/23/2012   Osteoarthritis 10/22/2012   Chronic insomnia 03/19/2012   Depression 10/07/2011   Obstructive sleep apnea on CPAP 10/07/2011    Past Surgical History:  Procedure Laterality Date   CARPAL TUNNEL RELEASE     CATARACT EXTRACTION W/PHACO Left 02/08/2015   Procedure: CATARACT EXTRACTION PHACO AND INTRAOCULAR LENS PLACEMENT (IOC);  Surgeon: Lockie Mola, MD;  Location: [facility];  Service: Ophthalmology;  Laterality: Left;  DIABETIC   CATARACT EXTRACTION W/PHACO Right 07/24/2022   Procedure: CATARACT EXTRACTION PHACO AND INTRAOCULAR LENS PLACEMENT (IOC) RIGHT DIABETIC 8.30 00:58.7;  Surgeon: Lockie Mola, MD;  Location: [facility];  Service: Ophthalmology;  Laterality: Right;   CESAREAN SECTION     COLONOSCOPY     COLONOSCOPY WITH PROPOFOL N/A 09/15/2017   Procedure: COLONOSCOPY WITH PROPOFOL;  Surgeon: Christena Deem, MD;  Location: [facility] ENDOSCOPY;  Service: Endoscopy;  Laterality: N/A;   COLONOSCOPY WITH PROPOFOL N/A 12/08/2017   Procedure: COLONOSCOPY WITH PROPOFOL;  Surgeon: Christena Deem, MD;  Location: [facility] ENDOSCOPY;  Service: Endoscopy;  Laterality: N/A;   HERNIA REPAIR     KNEE ARTHROSCOPY      OB History   No obstetric history on file.      Home Medications    Prior to  Admission medications   Medication Sig Start Date End Date Taking? Authorizing Provider  fluticasone (FLONASE) 50 MCG/ACT nasal spray Place 1 spray into both nostrils 2 (two) times daily as needed for allergies or rhinitis. 03/25/23  Yes Bing Neighbors, NP  nirmatrelvir & ritonavir (PAXLOVID, 150/100,) 10 x 150 MG & 10 x 100MG  TBPK Take 2 tablets by mouth 2 (two) times daily for 5 days. GFR >60 03/25/23 03/30/23 Yes Bing Neighbors, NP  Acetaminophen (TYLENOL PO) Take 500 mg by mouth as needed.    [provider]  albuterol  (PROVENTIL HFA;VENTOLIN HFA) 108 (90 BASE) MCG/ACT inhaler Inhale 2 puffs into the lungs every 6 (six) hours as needed for wheezing or shortness of breath. Patient not taking: Reported on 10/15/2022    [provider]  aspirin EC 81 MG tablet Take 81 mg by mouth daily.    [provider]  benzonatate (TESSALON) 100 MG capsule Take 1 capsule (100 mg total) by mouth 3 (three) times daily as needed for cough. Patient not taking: Reported on 10/15/2022 08/21/21   Mickie Bail, NP  buPROPion (WELLBUTRIN XL) 150 MG 24 hr tablet Take 150 mg by mouth daily.    [provider]  carvedilol (COREG) 6.25 MG tablet Take 6.25 mg by mouth 2 (two) times daily with a meal.    [provider]  Cholecalciferol 2000 UNITS CAPS Take 2,000 Units by mouth daily.     [provider]  clonazePAM (KLONOPIN) 0.5 MG tablet Take 0.5 mg by mouth 2 (two) times daily.    [provider]  Dulaglutide (TRULICITY) 0.75 MG/0.5ML SOPN Inject into the skin. 12/23/19   [provider]  empagliflozin (JARDIANCE) 10 MG TABS tablet Take 10 mg by mouth daily. 09/13/16   [provider]  Ferrous Gluconate (IRON 27 PO) Take 1 tablet by mouth daily.    [provider]  hydrochlorothiazide (HYDRODIURIL) 12.5 MG tablet Take 12.5 mg by mouth daily.    [provider]  HYDROcodone-acetaminophen (NORCO/VICODIN) 5-325 MG tablet Take 1 tablet by mouth every 8 (eight) hours as needed for moderate pain or severe pain. Patient not taking: Reported on 10/15/2022 11/03/20   Tommie Sams, DO  hydrOXYzine (ATARAX/VISTARIL) 25 MG tablet Take 1 tablet (25 mg total) by mouth 3 (three) times daily as needed for itching. Patient not taking: Reported on 03/25/2023 03/30/18   Renford Dills, NP  insulin aspart (NOVOLOG) 100 UNIT/ML injection Inject 10 Units into the skin 3 (three) times daily with meals. 06/28/16   Enid Baas, MD  Insulin Glargine (BASAGLAR KWIKPEN [place]) Inject  35 Units into the skin every evening.    [provider]  insulin glargine (LANTUS) 100 UNIT/ML injection Inject 70 Units into the skin at bedtime.    [provider]  lisinopril (PRINIVIL,ZESTRIL) 40 MG tablet Take 20 mg by mouth daily.    [provider]  magnesium oxide (MAG-OX) 400 MG tablet Take 400 mg by mouth 2 (two) times daily.    [provider]  meclizine (ANTIVERT) 12.5 MG tablet Take 1 tablet (12.5 mg total) by mouth 3 (three) times daily as needed for dizziness or nausea. 11/04/22   Sharyn Creamer, MD  meloxicam (MOBIC) 15 MG tablet Take 0.5-1 tablets (7.5-15 mg total) by mouth daily as needed. 11/03/20   Tommie Sams, DO  metFORMIN (GLUCOPHAGE) 1000 MG tablet Take 1,000 mg by mouth daily with breakfast.     [provider]  Multiple Vitamin (  MULTIVITAMIN) tablet Take 1 tablet by mouth daily.    [provider]  omega-3 acid ethyl esters (LOVAZA) 1 G capsule Take 1 g by mouth at bedtime.    [provider]  ondansetron (ZOFRAN) 4 MG tablet Take 1 tablet (4 mg total) by mouth every 6 (six) hours as needed for nausea or vomiting. 10/15/22   Toney Reil, MD  oxybutynin (DITROPAN-XL) 5 MG 24 hr tablet Take 5 mg by mouth at bedtime.    [provider]  sertraline (ZOLOFT) 100 MG tablet Take 100 mg by mouth daily.    [provider]  simvastatin (ZOCOR) 40 MG tablet Take 40 mg by mouth at bedtime.     [provider]  traZODone (DESYREL) 50 MG tablet Take 50 mg by mouth at bedtime as needed. 09/19/20   [provider]  liraglutide 18 MG/3ML SOPN Inject 1.8 mg into the skin daily. 09/13/16 11/03/20  [provider]    Family History Family History  Problem Relation Age of Onset   Cancer Mother    Aneurysm Father    Breast cancer Neg Hx     Social History Social History   Tobacco Use   Smoking status: Never   Smokeless tobacco: Never  Vaping Use   Vaping status: Never Used   Substance Use Topics   Alcohol use: No   Drug use: No     Allergies   Ciprofloxacin, Pseudoephedrine-guaifenesin, and Tylenol sinus severe [pseudoephed-apap-guaifenesin]   Review of Systems Review of Systems Pertinent negatives listed in HPI   Physical Exam Triage Vital Signs ED Triage Vitals  Encounter Vitals Group     BP 03/25/23 1346 136/66     Systolic BP Percentile --      Diastolic BP Percentile --      Pulse Rate 03/25/23 1346 60     Resp 03/25/23 1346 20     Temp 03/25/23 1346 98 F (36.7 C)     Temp src --      SpO2 03/25/23 1346 95 %     Weight --      Height --      Head Circumference --      Peak Flow --      Pain Score 03/25/23 1344 8     Pain Loc --      Pain Education --      Exclude from Growth Chart --    No data found.  Updated Vital Signs BP 136/66   Pulse 60   Temp 98 F (36.7 C)   Resp 20   SpO2 95%   Visual Acuity Right Eye Distance:   Left Eye Distance:   Bilateral Distance:    Right Eye Near:   Left Eye Near:    Bilateral Near:     Physical Exam General Appearance:    Alert, cooperative, no distress  HENT:  Normocephalic, bilateral TM normal without fluid or infection, neck has bilateral anterior cervical nodes enlarged, throat normal without erythema or exudate,frontal and maxillary sinus tender, post nasal drip noted, and nasal mucosa congested  Eyes:    PERRL, conjunctiva/corneas clear, EOM's intact       Lungs:     Clear to auscultation bilaterally, respirations unlabored  Heart:    Regular rate and rhythm  Neurologic:   Awake, alert, oriented x 3. No apparent focal neurological           defect.         UC Treatments /  Results  Labs (all labs ordered are listed, but only abnormal results are displayed) Labs Reviewed  SARS CORONAVIRUS 2 (TAT 6-24 HRS)    EKG   Radiology No results found.  Procedures Procedures (including critical care time)  Medications Ordered in UC Medications - No data to  display  Initial Impression / Assessment and Plan / UC Course  I have reviewed the triage vital signs and the nursing notes.  Pertinent labs & imaging results that were available during my care of the patient were reviewed by me and considered in my medical decision making (see chart for details).   Positive home COVID test, patient would like confirmatory testing with PCR COVID test. Initiated antiviral-Paxlovid. Hold statin therapy until 24 hours after last dose Paxlovid. Encouraged hydration and symptom management. ED precautions given if symptom worsen.  Final Clinical Impressions(s) / UC Diagnoses   Final diagnoses:  Encounter for screening laboratory testing for COVID-19 virus  Respiratory tract infection due to COVID-19 virus     Discharge Instructions      Hold Simvastatin while taking Paxlovid due to medication interaction. May resume Simvastatin one day after completing  Paxlovid. Recommend starting plain Mucinex to help with cough and congestion. Prescribed Flonase to help with nasal symptoms.  If you develop any shortness of breath or chest tightness or pain go to the emergency department immediately.  Given that you are 5 days into your illness your symptoms should gradually improve over the next few days..   ED Prescriptions     Medication Sig Dispense Auth. Provider   nirmatrelvir & ritonavir (PAXLOVID, 150/100,) 10 x 150 MG & 10 x 100MG  TBPK Take 2 tablets by mouth 2 (two) times daily for 5 days. GFR >60 20 tablet Bing Neighbors, NP   fluticasone (FLONASE) 50 MCG/ACT nasal spray Place 1 spray into both nostrils 2 (two) times daily as needed for allergies or rhinitis. 11.1 mL Bing Neighbors, NP      PDMP not reviewed this encounter.   Bing Neighbors, NP 03/27/23 2006

## 2023-03-25 NOTE — Discharge Instructions (Signed)
Hold Simvastatin while taking Paxlovid due to medication interaction. May resume Simvastatin one day after completing  Paxlovid. Recommend starting plain Mucinex to help with cough and congestion. Prescribed Flonase to help with nasal symptoms.  If you develop any shortness of breath or chest tightness or pain go to the emergency department immediately.  Given that you are 5 days into your illness your symptoms should gradually improve over the next few days.Diana Roberts

## 2023-03-25 NOTE — ED Triage Notes (Signed)
Patient to Urgent Care with complaints of  headache/ productive cough with dark green mucus/ sinus congestion. Reports going on a cruise last week. Symptoms started on Thursday. Low grade fever at home. Vertigo last night.   Positive Covid test last night/ this morning.

## 2023-04-18 ENCOUNTER — Encounter
Admission: RE | Disposition: A | Discharge status: Home / Self Care | Payer: Self-pay | Source: Home / Self Care | Attending: Gastroenterology

## 2023-04-18 ENCOUNTER — Ambulatory Visit: Payer: Medicare HMO | Admitting: Certified Registered"

## 2023-04-18 ENCOUNTER — Ambulatory Visit
Admission: RE | Admit: 2023-04-18 | Discharge: 2023-04-18 | Disposition: A | Discharge status: Home / Self Care | Payer: Medicare HMO | Attending: Gastroenterology | Admitting: Gastroenterology

## 2023-04-18 DIAGNOSIS — Z1211 Encounter for screening for malignant neoplasm of colon: Secondary | ICD-10-CM | POA: Diagnosis present

## 2023-04-18 DIAGNOSIS — Z86010 Personal history of colonic polyps: Secondary | ICD-10-CM | POA: Insufficient documentation

## 2023-04-18 DIAGNOSIS — E669 Obesity, unspecified: Secondary | ICD-10-CM | POA: Insufficient documentation

## 2023-04-18 DIAGNOSIS — K640 First degree hemorrhoids: Secondary | ICD-10-CM | POA: Insufficient documentation

## 2023-04-18 DIAGNOSIS — E119 Type 2 diabetes mellitus without complications: Secondary | ICD-10-CM | POA: Diagnosis not present

## 2023-04-18 DIAGNOSIS — I10 Essential (primary) hypertension: Secondary | ICD-10-CM | POA: Insufficient documentation

## 2023-04-18 DIAGNOSIS — Z7985 Long-term (current) use of injectable non-insulin antidiabetic drugs: Secondary | ICD-10-CM | POA: Diagnosis not present

## 2023-04-18 DIAGNOSIS — Z7984 Long term (current) use of oral hypoglycemic drugs: Secondary | ICD-10-CM | POA: Diagnosis not present

## 2023-04-18 DIAGNOSIS — Z794 Long term (current) use of insulin: Secondary | ICD-10-CM | POA: Diagnosis not present

## 2023-04-18 DIAGNOSIS — Z09 Encounter for follow-up examination after completed treatment for conditions other than malignant neoplasm: Secondary | ICD-10-CM | POA: Diagnosis not present

## 2023-04-18 HISTORY — PX: COLONOSCOPY WITH PROPOFOL: SHX5780

## 2023-04-18 LAB — GLUCOSE, CAPILLARY: Glucose-Capillary: 126 mg/dL — ABNORMAL HIGH (ref 70–99)

## 2023-04-18 SURGERY — COLONOSCOPY WITH PROPOFOL
Anesthesia: General

## 2023-04-18 MED ORDER — SODIUM CHLORIDE 0.9 % IV SOLN
INTRAVENOUS | Status: DC
  Administered 2023-04-18: 20 mL/h via INTRAVENOUS

## 2023-04-18 MED ORDER — GLYCOPYRROLATE 0.2 MG/ML IJ SOLN
INTRAMUSCULAR | Status: DC | PRN
  Administered 2023-04-18: .2 mg via INTRAVENOUS

## 2023-04-18 MED ORDER — LIDOCAINE HCL (CARDIAC) PF 100 MG/5ML IV SOSY
PREFILLED_SYRINGE | INTRAVENOUS | Status: DC | PRN
  Administered 2023-04-18: 100 mg via INTRAVENOUS

## 2023-04-18 MED ORDER — PROPOFOL 10 MG/ML IV BOLUS
INTRAVENOUS | Status: DC | PRN
  Administered 2023-04-18: 20 mg via INTRAVENOUS
  Administered 2023-04-18: 50 mg via INTRAVENOUS
  Administered 2023-04-18: 10 mg via INTRAVENOUS
  Administered 2023-04-18: 20 mg via INTRAVENOUS

## 2023-04-18 MED ORDER — PROPOFOL 500 MG/50ML IV EMUL
INTRAVENOUS | Status: DC | PRN
  Administered 2023-04-18: 165 ug/kg/min via INTRAVENOUS

## 2023-04-18 NOTE — Op Note (Signed)
[facility] Patient Name: Diana Roberts Procedure Date: 04/18/2023 10:40 AM MRN: [id number] Account #: [id number] Date of Birth: 05-21-1951 Admit Type: Outpatient Age: 72 Room: [facility] ENDO ROOM 3 Gender: Female Note Status: Finalized Instrument Name: Prentice Docker [id number] Procedure:             Colonoscopy Indications:           Surveillance: Personal history of adenomatous polyps                         on last colonoscopy 5 years ago Providers:             Eather Colas MD, MD Referring MD:          Eather Colas MD, MD (Referring MD), No Local Md,                         MD (Referring MD) Medicines:             Monitored Anesthesia Care Complications:         No immediate complications. Procedure:             Pre-Anesthesia Assessment:                        - Prior to the procedure, a History and Physical was                         performed, and patient medications and allergies were                         reviewed. The patient is competent. The risks and                         benefits of the procedure and the sedation options and                         risks were discussed with the patient. All questions                         were answered and informed consent was obtained.                         Patient identification and proposed procedure were                         verified by the physician, the nurse, the                         anesthesiologist, the anesthetist and the technician                         in the endoscopy suite. Mental Status Examination:                         alert and oriented. Airway Examination: normal                         oropharyngeal airway and neck mobility. Respiratory  Examination: clear to auscultation. CV Examination:                         normal. Prophylactic Antibiotics: The patient does not                         require prophylactic antibiotics. Prior                          Anticoagulants: The patient has taken no anticoagulant                         or antiplatelet agents. ASA Grade Assessment: III - A                         patient with severe systemic disease. After reviewing                         the risks and benefits, the patient was deemed in                         satisfactory condition to undergo the procedure. The                         anesthesia plan was to use monitored anesthesia care                         (MAC). Immediately prior to administration of                         medications, the patient was re-assessed for adequacy                         to receive sedatives. The heart rate, respiratory                         rate, oxygen saturations, blood pressure, adequacy of                         pulmonary ventilation, and response to care were                         monitored throughout the procedure. The physical                         status of the patient was re-assessed after the                         procedure.                        After obtaining informed consent, the colonoscope was                         passed under direct vision. Throughout the procedure,                         the patient's blood pressure, pulse, and oxygen  saturations were monitored continuously. The                         Colonoscope was introduced through the anus and                         advanced to the the cecum, identified by appendiceal                         orifice and ileocecal valve. The colonoscopy was                         somewhat difficult due to restricted mobility of the                         colon. Successful completion of the procedure was                         aided by withdrawing the scope and replacing with the                         pediatric colonoscope. The patient tolerated the                         procedure well. The quality of the bowel preparation                          was good except the ascending colon was poor. The                         ileocecal valve, appendiceal orifice, and rectum were                         photographed. Findings:      The perianal and digital rectal examinations were normal.      Internal hemorrhoids were found during retroflexion. The hemorrhoids       were Grade I (internal hemorrhoids that do not prolapse).      The exam was otherwise without abnormality on direct and retroflexion       views. Impression:            - Internal hemorrhoids.                        - The examination was otherwise normal on direct and                         retroflexion views.                        - No specimens collected. Recommendation:        - Discharge patient to home.                        - Resume previous diet.                        - Continue present medications.                        -  Repeat colonoscopy in 1 year because the bowel                         preparation was suboptimal.                        - Return to referring physician as previously                         scheduled. Procedure Code(s):     --- Professional ---                        Y7829, Colorectal cancer screening; colonoscopy on                         individual at high risk Diagnosis Code(s):     --- Professional ---                        Z86.010, Personal history of colonic polyps                        K64.0, First degree hemorrhoids CPT copyright 2022 [employer]. All rights reserved. The codes documented in this report are preliminary and upon coder review may  be revised to meet current compliance requirements. Eather Colas MD, MD 04/18/2023 11:19:43 AM Number of Addenda: 0 Note Initiated On: 04/18/2023 10:40 AM Scope Withdrawal Time: 0 hours 6 minutes 6 seconds  Total Procedure Duration: 0 hours 26 minutes 49 seconds  Estimated Blood Loss:  Estimated blood loss: none.      [facility]

## 2023-04-18 NOTE — Transfer of Care (Signed)
Immediate Anesthesia Transfer of Care Note  Patient: Diana Roberts  Procedure(s) Performed: COLONOSCOPY WITH PROPOFOL  Patient Location: Endoscopy Unit  Anesthesia Type:General  Level of Consciousness: drowsy and patient cooperative  Airway & Oxygen Therapy: Patient Spontanous Breathing and Patient connected to face mask oxygen  Post-op Assessment: Report given to RN and Post -op Vital signs reviewed and stable  Post vital signs: Reviewed and stable  Last Vitals:  Vitals Value Taken Time  BP 131/50 04/18/23 1119  Temp    Pulse 88 04/18/23 1119  Resp 17 04/18/23 1119  SpO2 95 % 04/18/23 1119    Last Pain:  Vitals:   04/18/23 1119  TempSrc:   PainSc: Asleep         Complications: No notable events documented.

## 2023-04-18 NOTE — Anesthesia Procedure Notes (Signed)
Procedure Name: General with mask airway Date/Time: 04/18/2023 10:50 AM  Performed by: Mohammed Kindle, CRNAPre-anesthesia Checklist: Patient identified, Emergency Drugs available, Suction available and Patient being monitored Patient Re-evaluated:Patient Re-evaluated prior to induction Oxygen Delivery Method: Simple face mask Induction Type: IV induction Placement Confirmation: positive ETCO2, CO2 detector and breath sounds checked- equal and bilateral Dental Injury: Teeth and Oropharynx as per pre-operative assessment

## 2023-04-18 NOTE — Anesthesia Postprocedure Evaluation (Signed)
Anesthesia Post Note  Patient: Diana Roberts  Procedure(s) Performed: COLONOSCOPY WITH PROPOFOL  Patient location during evaluation: Endoscopy Anesthesia Type: General Level of consciousness: awake and alert Pain management: pain level controlled Vital Signs Assessment: post-procedure vital signs reviewed and stable Respiratory status: spontaneous breathing, nonlabored ventilation, respiratory function stable and patient connected to nasal cannula oxygen Cardiovascular status: blood pressure returned to baseline and stable Postop Assessment: no apparent nausea or vomiting Anesthetic complications: no   No notable events documented.   Last Vitals:  Vitals:   04/18/23 1129 04/18/23 1138  BP: (!) 150/52 (!) 174/80  Pulse: 86   Resp: 20   Temp: (!) 35.6 C   SpO2: 96%     Last Pain:  Vitals:   04/18/23 1129  TempSrc: Temporal  PainSc: 0-No pain                 Corinda Gubler

## 2023-04-18 NOTE — H&P (Signed)
Outpatient short stay form Pre-procedure 04/18/2023  Regis Bill, MD  Primary Physician: Einar Grad, MD  Reason for visit:  Surveillance  History of present illness:    72 y/o lady with history of DM II, hypertension, and obesity here for colonoscopy for history of polyps. No blood thinners. No family history of GI malignancies. History of ventral hernias.    Current Facility-Administered Medications:    0.9 %  sodium chloride infusion, , Intravenous, Continuous, Nereida Schepp, Rossie Muskrat, MD, Last Rate: 20 mL/hr at 04/18/23 1014, 20 mL/hr at 04/18/23 1014  Medications Prior to Admission  Medication Sig Dispense Refill Last Dose   Acetaminophen (TYLENOL PO) Take 500 mg by mouth as needed.   Past Week   aspirin EC 81 MG tablet Take 81 mg by mouth daily.   Past Week   buPROPion (WELLBUTRIN XL) 150 MG 24 hr tablet Take 150 mg by mouth daily.   Past Week   carvedilol (COREG) 6.25 MG tablet Take 6.25 mg by mouth 2 (two) times daily with a meal.   Past Week   Cholecalciferol 2000 UNITS CAPS Take 2,000 Units by mouth daily.    Past Week   clonazePAM (KLONOPIN) 0.5 MG tablet Take 0.5 mg by mouth 2 (two) times daily.   Past Week   Dulaglutide (TRULICITY) 0.75 MG/0.5ML SOPN Inject into the skin.   Past Week   empagliflozin (JARDIANCE) 10 MG TABS tablet Take 10 mg by mouth daily.   Past Week   Ferrous Gluconate (IRON 27 PO) Take 1 tablet by mouth daily.   Past Week   fluticasone (FLONASE) 50 MCG/ACT nasal spray Place 1 spray into both nostrils 2 (two) times daily as needed for allergies or rhinitis. 11.1 mL 0 Past Week   insulin aspart (NOVOLOG) 100 UNIT/ML injection Inject 10 Units into the skin 3 (three) times daily with meals. 10 mL 11 Past Week   Insulin Glargine (BASAGLAR KWIKPEN Newman) Inject 35 Units into the skin every evening.   Past Week   insulin glargine (LANTUS) 100 UNIT/ML injection Inject 70 Units into the skin at bedtime.   Past Week   lisinopril (PRINIVIL,ZESTRIL) 40 MG tablet  Take 20 mg by mouth daily.   Past Week   magnesium oxide (MAG-OX) 400 MG tablet Take 400 mg by mouth 2 (two) times daily.   Past Week   meclizine (ANTIVERT) 12.5 MG tablet Take 1 tablet (12.5 mg total) by mouth 3 (three) times daily as needed for dizziness or nausea. 15 tablet 0 Past Week   meloxicam (MOBIC) 15 MG tablet Take 0.5-1 tablets (7.5-15 mg total) by mouth daily as needed. 30 tablet 0 Past Week   metFORMIN (GLUCOPHAGE) 1000 MG tablet Take 1,000 mg by mouth daily with breakfast.    Past Week   Multiple Vitamin (MULTIVITAMIN) tablet Take 1 tablet by mouth daily.   Past Week   omega-3 acid ethyl esters (LOVAZA) 1 G capsule Take 1 g by mouth at bedtime.   Past Week   ondansetron (ZOFRAN) 4 MG tablet Take 1 tablet (4 mg total) by mouth every 6 (six) hours as needed for nausea or vomiting. 10 tablet 0 Past Week   oxybutynin (DITROPAN-XL) 5 MG 24 hr tablet Take 5 mg by mouth at bedtime.   Past Week   sertraline (ZOLOFT) 100 MG tablet Take 100 mg by mouth daily.   Past Week   traZODone (DESYREL) 50 MG tablet Take 50 mg by mouth at bedtime as needed.   Past  Week   albuterol (PROVENTIL HFA;VENTOLIN HFA) 108 (90 BASE) MCG/ACT inhaler Inhale 2 puffs into the lungs every 6 (six) hours as needed for wheezing or shortness of breath. (Patient not taking: Reported on 10/15/2022)      benzonatate (TESSALON) 100 MG capsule Take 1 capsule (100 mg total) by mouth 3 (three) times daily as needed for cough. (Patient not taking: Reported on 10/15/2022) 21 capsule 0    hydrochlorothiazide (HYDRODIURIL) 12.5 MG tablet Take 12.5 mg by mouth daily.      HYDROcodone-acetaminophen (NORCO/VICODIN) 5-325 MG tablet Take 1 tablet by mouth every 8 (eight) hours as needed for moderate pain or severe pain. (Patient not taking: Reported on 10/15/2022) 10 tablet 0    hydrOXYzine (ATARAX/VISTARIL) 25 MG tablet Take 1 tablet (25 mg total) by mouth 3 (three) times daily as needed for itching. (Patient not taking: Reported on 03/25/2023) 20  tablet 0    simvastatin (ZOCOR) 40 MG tablet Take 40 mg by mouth at bedtime.  (Patient not taking: Reported on 04/18/2023)   Not Taking     Allergies  Allergen Reactions   Ciprofloxacin Hives   Pseudoephedrine-Guaifenesin    Tylenol Sinus Severe [Pseudoephed-Apap-Guaifenesin] Rash     Past Medical History:  Diagnosis Date   Allergic state    Anginal pain (HCC)    Anxiety    Arthritis    Chronic insomnia    Colon adenomas    De Quervain's disease (radial styloid tenosynovitis)    Depression    Diabetes mellitus without complication (HCC)    Headache    migraines   History of hiatal hernia    Hypercholesteremia    Hyperkalemia    Hyperlipidemia    Hypertension    Hypomagnesemia    Obesity    Seasonal allergies    Sleep apnea    Report - Care everywhere - 06/09/2014   TMJ (dislocation of temporomandibular joint)    Vitamin B12 deficiency    Vitamin D deficiency     Review of systems:  Otherwise negative.    Physical Exam  Gen: Alert, oriented. Appears stated age.  HEENT: PERRLA. Lungs: No respiratory distress CV: RRR Abd: soft, benign, no masses Ext: No edema    Planned procedures: Proceed with colonoscopy. The patient understands the nature of the planned procedure, indications, risks, alternatives and potential complications including but not limited to bleeding, infection, perforation, damage to internal organs and possible oversedation/side effects from anesthesia. The patient agrees and gives consent to proceed.  Please refer to procedure notes for findings, recommendations and patient disposition/instructions.     Regis Bill, MD [facility]

## 2023-04-18 NOTE — Anesthesia Preprocedure Evaluation (Signed)
Anesthesia Evaluation  Patient identified by MRN, date of birth, ID band Patient awake    Reviewed: Allergy & Precautions, H&P , NPO status , Patient's Chart, lab work & pertinent test results, reviewed documented beta blocker date and time   History of Anesthesia Complications Negative for: history of anesthetic complications  Airway Mallampati: II  TM Distance: >3 FB Neck ROM: full    Dental no notable dental hx. (+) Teeth Intact   Pulmonary sleep apnea and Continuous Positive Airway Pressure Ventilation , pneumonia, resolved, neg COPD, Patient abstained from smoking.Not current smoker   Pulmonary exam normal breath sounds clear to auscultation       Cardiovascular Exercise Tolerance: Poor METShypertension, On Medications (-) CAD and (-) Past MI (-) dysrhythmias  Rhythm:regular Rate:Normal     Neuro/Psych  Headaches PSYCHIATRIC DISORDERS Anxiety Depression       GI/Hepatic Neg liver ROS, hiatal hernia,neg GERD  ,,  Endo/Other  diabetes, Insulin Dependent  Off GLP1 agonist > 1 week  Renal/GU negative Renal ROS     Musculoskeletal   Abdominal   Peds  Hematology negative hematology ROS (+)   Anesthesia Other Findings Past Medical History: No date: Allergic state No date: Anginal pain (HCC) No date: Anxiety No date: Arthritis No date: Chronic insomnia No date: Colon adenomas No date: De Quervain's disease (radial styloid tenosynovitis) No date: Depression No date: Diabetes mellitus without complication (HCC) No date: Headache     Comment:  migraines No date: History of hiatal hernia No date: Hypercholesteremia No date: Hyperkalemia No date: Hyperlipidemia No date: Hypertension No date: Hypomagnesemia No date: Obesity No date: Seasonal allergies No date: Sleep apnea     Comment:  Report - Care everywhere - 06/09/2014 No date: TMJ (dislocation of temporomandibular joint) No date: Vitamin B12  deficiency No date: Vitamin D deficiency  Reproductive/Obstetrics negative OB ROS                             Anesthesia Physical Anesthesia Plan  ASA: 3  Anesthesia Plan: General   Post-op Pain Management: Minimal or no pain anticipated   Induction: Intravenous  PONV Risk Score and Plan: 2 and Propofol infusion, TIVA and Ondansetron  Airway Management Planned: Nasal Cannula  Additional Equipment: None  Intra-op Plan:   Post-operative Plan:   Informed Consent: I have reviewed the patients History and Physical, chart, labs and discussed the procedure including the risks, benefits and alternatives for the proposed anesthesia with the patient or authorized representative who has indicated his/her understanding and acceptance.     Dental advisory given  Plan Discussed with: CRNA and Surgeon  Anesthesia Plan Comments: (Discussed risks of anesthesia with patient, including possibility of difficulty with spontaneous ventilation under anesthesia necessitating airway intervention, PONV, and rare risks such as cardiac or respiratory or neurological events, and allergic reactions. Discussed the role of CRNA in patient's perioperative care. Patient understands.)       Anesthesia Quick Evaluation

## 2023-04-18 NOTE — Interval H&P Note (Signed)
History and Physical Interval Note:  04/18/2023 10:45 AM  Diana Roberts  has presented today for surgery, with the diagnosis of hx of adenonatous polyp of colon.  The various methods of treatment have been discussed with the patient and family. After consideration of risks, benefits and other options for treatment, the patient has consented to  Procedure(s): COLONOSCOPY WITH PROPOFOL (N/A) as a surgical intervention.  The patient's history has been reviewed, patient examined, no change in status, stable for surgery.  I have reviewed the patient's chart and labs.  Questions were answered to the patient's satisfaction.     Regis Bill  Ok to proceed with colonoscopy

## 2023-04-21 ENCOUNTER — Encounter: Payer: Self-pay | Admitting: Gastroenterology

## 2023-04-21 NOTE — Group Note (Deleted)

## 2023-09-08 ENCOUNTER — Other Ambulatory Visit: Payer: Self-pay | Admitting: Student

## 2023-09-08 DIAGNOSIS — Z1231 Encounter for screening mammogram for malignant neoplasm of breast: Secondary | ICD-10-CM

## 2023-10-08 ENCOUNTER — Ambulatory Visit
Admission: RE | Admit: 2023-10-08 | Discharge: 2023-10-08 | Disposition: A | Discharge status: Home / Self Care | Payer: Medicare HMO | Source: Ambulatory Visit | Attending: Student | Admitting: Student

## 2023-10-08 DIAGNOSIS — Z1231 Encounter for screening mammogram for malignant neoplasm of breast: Secondary | ICD-10-CM | POA: Insufficient documentation

## 2024-10-08 ENCOUNTER — Ambulatory Visit: Admission: RE | Admit: 2024-10-08 | Source: Home / Self Care

## 2024-10-08 ENCOUNTER — Encounter: Admission: RE | Payer: Self-pay | Source: Home / Self Care

## 2024-10-08 SURGERY — COLONOSCOPY
Anesthesia: General
